# Patient Record
Sex: Female | Born: 1995 | Race: Black or African American | Hispanic: No | Marital: Single | Smoking: Never smoker
Health system: Southern US, Community
[De-identification: ages and names within clinical notes are randomized; demographics above are authoritative.]

## PROBLEM LIST (undated history)

## (undated) DIAGNOSIS — N760 Acute vaginitis: Secondary | ICD-10-CM

## (undated) DIAGNOSIS — B9689 Other specified bacterial agents as the cause of diseases classified elsewhere: Secondary | ICD-10-CM

---
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
## 2016-07-29 ENCOUNTER — Encounter (HOSPITAL_COMMUNITY): Payer: Self-pay | Admitting: Emergency Medicine

## 2016-07-29 ENCOUNTER — Emergency Department (HOSPITAL_COMMUNITY)
Admission: EM | Admit: 2016-07-29 | Discharge: 2016-07-29 | Disposition: A | Discharge status: Home / Self Care | Payer: Medicaid Other | Attending: Emergency Medicine | Admitting: Emergency Medicine

## 2016-07-29 DIAGNOSIS — Z23 Encounter for immunization: Secondary | ICD-10-CM | POA: Insufficient documentation

## 2016-07-29 DIAGNOSIS — Y939 Activity, unspecified: Secondary | ICD-10-CM | POA: Insufficient documentation

## 2016-07-29 DIAGNOSIS — Y929 Unspecified place or not applicable: Secondary | ICD-10-CM | POA: Diagnosis not present

## 2016-07-29 DIAGNOSIS — Y999 Unspecified external cause status: Secondary | ICD-10-CM | POA: Insufficient documentation

## 2016-07-29 DIAGNOSIS — S61211A Laceration without foreign body of left index finger without damage to nail, initial encounter: Secondary | ICD-10-CM | POA: Insufficient documentation

## 2016-07-29 DIAGNOSIS — S61219A Laceration without foreign body of unspecified finger without damage to nail, initial encounter: Secondary | ICD-10-CM

## 2016-07-29 DIAGNOSIS — W260XXA Contact with knife, initial encounter: Secondary | ICD-10-CM | POA: Diagnosis not present

## 2016-07-29 MED ORDER — TETANUS-DIPHTH-ACELL PERTUSSIS 5-2.5-18.5 LF-MCG/0.5 IM SUSP
0.5000 mL | Freq: Once | INTRAMUSCULAR | Status: AC
  Administered 2016-07-29: 0.5 mL via INTRAMUSCULAR
  Filled 2016-07-29: qty 0.5

## 2016-07-29 NOTE — ED Provider Notes (Signed)
[facility]EM[facility] DEPT Provider Note   CSN: [id number] Arrival date & time: 07/29/16  1647  By signing my name below, I, Clovis Pu, attest that this documentation has been prepared under the direction and in the presence of  Jejuan Scala, [place]. Electronically Signed: Clovis Pu, ED Scribe. 07/29/16. 5:21 PM.   History   Chief Complaint Chief Complaint  Patient presents with  . Laceration    finger      The history is provided by the patient. No language interpreter was used.   HPI Comments:  Tammy Sanchez is a 20 y.o. female who presents to the Emergency Department complaining of a mild laceration to the left index finger which occurred 15 minutes prior to arrival. Accidentally cut herself with a box cutter.She notes the severity of the pain as being a "5/10" and is able to bend and straighten her index finger. Pt notes she has cleaned the wound.Pt has no other physical complaints at this time. No alleviating factors noted. Pt is not sure if her tetanus is UTD.   History reviewed. No pertinent past medical history.  There are no active problems to display for this patient.   History reviewed. No pertinent surgical history.  OB History    No data available       Home Medications    Prior to Admission medications   Medication Sig Start Date End Date Taking? Authorizing Provider  fluticasone (FLONASE) 50 MCG/ACT nasal spray Place 2 sprays into both nostrils daily. 02/11/15 03/18/15  Tamika Bush, DO  metroNIDAZOLE (FLAGYL) 500 MG tablet Take 1 tablet (500 mg total) by mouth 2 (two) times daily. 12/26/15   Barrett Henle, PA-C    Family History No family history on file.  Social History Social History  Substance Use Topics  . Smoking status: Never Smoker  . Smokeless tobacco: Never Used  . Alcohol use No     Allergies   Review of patient's allergies indicates no known allergies.   Review of Systems Review of Systems 10 systems reviewed and all are negative  for acute change except as noted in the HPI.    Physical Exam Updated Vital Signs Ht 5\' 1"  (1.549 m)   Wt 110 lb (49.9 kg)   LMP 07/29/2016   BMI 20.78 kg/m   Physical Exam  Constitutional: She appears well-developed and well-nourished. No distress.  HENT:  Head: Normocephalic and atraumatic.  Neck: Neck supple.  Pulmonary/Chest: Effort normal.  Neurological: She is alert.  Skin: She is not diaphoretic.  Proximal aspect of radial left index finger with superficial 0.5cm laceration across MCP. No active bleeding. no foreign body. Full ROM of all fingers with brisk capillary refill     Nursing note and vitals reviewed.    ED Treatments / Results  DIAGNOSTIC STUDIES:  Oxygen Saturation is 100% on RA, normal by my interpretation.    COORDINATION OF CARE:  5:14 PM Discussed treatment plan with pt at bedside and pt agreed to plan.  Labs (all labs ordered are listed, but only abnormal results are displayed) Labs Reviewed - No data to display  EKG  EKG Interpretation None       Radiology No results found.  Procedures Procedures (including critical care time)  Medications Ordered in ED Medications  Tdap (BOOSTRIX) injection 0.5 mL (not administered)     Initial Impression / Assessment and Plan / ED Course  I have reviewed the triage vital signs and the nursing notes.  Pertinent labs & imaging results that  were available during my care of the patient were reviewed by me and considered in my medical decision making (see chart for details).  Clinical Course    Tetanus updated in ED. Laceration is very superficial. I visualized to the bottom of the wound and saw no tendon or bone involvement. No foreign bodies. Copiously irrigated the wound in the ED. Laceration is very superficial and does not warrant suture repair. I offered dermabond vs bandage/abx ointment. Pt would prefer not to have dermabond. I think this is reasonable. Discussed laceration care with pt  and answered questions. Pt is hemodynamically stable with no complaints prior to dc. The patient appears reasonably screened and/or stabilized for discharge and I doubt any other medical condition or other [facility] requiring further screening, evaluation, or treatment in the ED at this time prior to discharge.    Final Clinical Impressions(s) / ED Diagnoses   Final diagnoses:  Laceration of finger, initial encounter    New Prescriptions Discharge Medication List as of 07/29/2016  5:55 PM    I personally performed the services described in this documentation, which was scribed in my presence. The recorded information has been reviewed and is accurate.    Carlene CoriaSerena Y Bow Buntyn, PA-C 07/29/16 1942    Shaune Pollackameron Isaacs, MD 07/30/16 [phone]

## 2016-07-29 NOTE — Discharge Instructions (Signed)
Take ibuprofen as needed for pain. Keep the cut clean with warm water and soap. Return to the ER for new or worsening symptoms.

## 2016-07-29 NOTE — ED Triage Notes (Signed)
Patient c/o cutting finger with box cutter. Bleeding controlled. Patient c/o 5/10 pain.

## 2016-08-14 ENCOUNTER — Emergency Department (HOSPITAL_COMMUNITY)
Admission: EM | Admit: 2016-08-14 | Discharge: 2016-08-15 | Disposition: A | Discharge status: Home / Self Care | Payer: Medicaid Other | Attending: Physician Assistant | Admitting: Physician Assistant

## 2016-08-14 ENCOUNTER — Encounter (HOSPITAL_COMMUNITY): Payer: Self-pay

## 2016-08-14 DIAGNOSIS — N390 Urinary tract infection, site not specified: Secondary | ICD-10-CM | POA: Insufficient documentation

## 2016-08-14 DIAGNOSIS — B9689 Other specified bacterial agents as the cause of diseases classified elsewhere: Secondary | ICD-10-CM | POA: Diagnosis not present

## 2016-08-14 DIAGNOSIS — N760 Acute vaginitis: Secondary | ICD-10-CM | POA: Diagnosis not present

## 2016-08-14 DIAGNOSIS — R1030 Lower abdominal pain, unspecified: Secondary | ICD-10-CM | POA: Diagnosis present

## 2016-08-14 LAB — URINALYSIS, ROUTINE W REFLEX MICROSCOPIC
BILIRUBIN URINE: NEGATIVE
GLUCOSE, UA: NEGATIVE mg/dL
Hgb urine dipstick: NEGATIVE
Ketones, ur: NEGATIVE mg/dL
Nitrite: NEGATIVE
Protein, ur: NEGATIVE mg/dL
Specific Gravity, Urine: 1.017 (ref 1.005–1.030)
pH: 7 (ref 5.0–8.0)

## 2016-08-14 LAB — WET PREP, GENITAL
SPERM: NONE SEEN
TRICH WET PREP: NONE SEEN
Yeast Wet Prep HPF POC: NONE SEEN

## 2016-08-14 LAB — URINE MICROSCOPIC-ADD ON: RBC / HPF: NONE SEEN RBC/hpf (ref 0–5)

## 2016-08-14 MED ORDER — AZITHROMYCIN 250 MG PO TABS
1000.0000 mg | ORAL_TABLET | Freq: Once | ORAL | Status: AC
  Administered 2016-08-14: 1000 mg via ORAL
  Filled 2016-08-14: qty 4

## 2016-08-14 MED ORDER — LIDOCAINE HCL (PF) 1 % IJ SOLN
INTRAMUSCULAR | Status: AC
  Administered 2016-08-14: 0.9 mL
  Filled 2016-08-14: qty 5

## 2016-08-14 MED ORDER — CEFTRIAXONE SODIUM 250 MG IJ SOLR
250.0000 mg | Freq: Once | INTRAMUSCULAR | Status: AC
  Administered 2016-08-14: 250 mg via INTRAMUSCULAR
  Filled 2016-08-14: qty 250

## 2016-08-14 NOTE — ED Triage Notes (Signed)
Pt states that she has been having lower abd pain and vaginal discharge with odor for the past three days. Pt states she was recently diagnosed with BV and this feels the same.

## 2016-08-15 LAB — GC/CHLAMYDIA PROBE AMP (~~LOC~~) NOT AT ARMC
CHLAMYDIA, DNA PROBE: NEGATIVE
NEISSERIA GONORRHEA: NEGATIVE

## 2016-08-15 MED ORDER — FLUCONAZOLE 150 MG PO TABS: 150.0000 mg | ORAL_TABLET | Freq: Once | ORAL | 0 refills | Status: AC

## 2016-08-15 MED ORDER — METRONIDAZOLE 500 MG PO TABS: 500.0000 mg | ORAL_TABLET | Freq: Two times a day (BID) | ORAL | 0 refills | Status: DC

## 2016-08-15 MED ORDER — CEPHALEXIN 500 MG PO CAPS: 500.0000 mg | ORAL_CAPSULE | Freq: Three times a day (TID) | ORAL | 0 refills | Status: DC

## 2016-08-15 NOTE — Discharge Instructions (Signed)
Take both antibiotics as prescribed for one week. After completion of antibiotics take the diflucan for yeast infection. Return to the ER for new or worsening symptoms.

## 2016-08-15 NOTE — ED Notes (Signed)
Pt d/c home  

## 2016-08-15 NOTE — ED Provider Notes (Signed)
[facility]PT Provider Note   CSN: [id number] Arrival date & time: 08/14/16  1928  History   Chief Complaint Chief Complaint  Patient presents with  . Exposure to STD  . Abdominal Pain   HPI  Tammy Sanchez is an 20 y.o. female who presents to the ED for evaluation of vaginal discharge. She states she noticed a malodorous thick white discharge starting three days ago. She notes associated pressure in her lower abdomen. Denies nausea, vomiting, diarrhea. Denies vaginal bleeding. She states she has had BV before and this feels identical. Denies new rashes or lesions. She is sexually active with one partner and they do not use any barrier protection. Denies fever or chills. Denies dysuria. She does endorse urinary hesitancy and pressure. Denies hematuria.   History reviewed. No pertinent past medical history.  There are no active problems to display for this patient.   History reviewed. No pertinent surgical history.  OB History    No data available       Home Medications    Prior to Admission medications   Medication Sig Start Date End Date Taking? Authorizing Provider  fluticasone (FLONASE) 50 MCG/ACT nasal spray Place 2 sprays into both nostrils daily. Patient not taking: Reported on 08/14/2016 02/11/15 08/14/16  Tamika Bush, DO  metroNIDAZOLE (FLAGYL) 500 MG tablet Take 1 tablet (500 mg total) by mouth 2 (two) times daily. Patient not taking: Reported on 08/14/2016 12/26/15   Barrett Henle, PA-C    Family History No family history on file.  Social History Social History  Substance Use Topics  . Smoking status: Never Smoker  . Smokeless tobacco: Never Used  . Alcohol use No     Allergies   Review of patient's allergies indicates no known allergies.   Review of Systems Review of Systems 10 Systems reviewed and are negative for acute change except as noted in the HPI.  Physical Exam Updated Vital Signs BP 120/73   Pulse 72   Temp 99.1 F (37.3 C)  (Oral)   Resp 16   Ht 5\' 1"  (1.549 m)   Wt 49.9 kg   LMP 07/29/2016   SpO2 100%   BMI 20.78 kg/m   Physical Exam  Constitutional: She is oriented to person, place, and time.  HENT:  Right Ear: External ear normal.  Left Ear: External ear normal.  Nose: Nose normal.  Mouth/Throat: Oropharynx is clear and moist. No oropharyngeal exudate.  Eyes: Conjunctivae are normal.  Neck: Neck supple.  Cardiovascular: Normal rate, regular rhythm, normal heart sounds and intact distal pulses.   Pulmonary/Chest: Effort normal and breath sounds normal. No respiratory distress. She has no wheezes.  Abdominal: Soft. Bowel sounds are normal. She exhibits no distension. There is no tenderness. There is no rebound and no guarding.  No CVA tenderness  Genitourinary:  Genitourinary Comments: Thick white discharge in vaginal vault with fishy odor. Cervix nonfriable. No CMT or adnexal tenderness. No external lesions.  Musculoskeletal: She exhibits no edema.  Lymphadenopathy:    She has no cervical adenopathy.  Neurological: She is alert and oriented to person, place, and time. No cranial nerve deficit.  Skin: Skin is warm and dry.  Psychiatric: She has a normal mood and affect.  Nursing note and vitals reviewed.    ED Treatments / Results  Labs (all labs ordered are listed, but only abnormal results are displayed) Labs Reviewed  WET PREP, GENITAL - Abnormal; Notable for the following:       Result Value  Clue Cells Wet Prep HPF POC PRESENT (*)    WBC, Wet Prep HPF POC FEW (*)    All other components within normal limits  URINALYSIS, ROUTINE W REFLEX MICROSCOPIC (NOT AT [facility]) - Abnormal; Notable for the following:    APPearance CLOUDY (*)    Leukocytes, UA SMALL (*)    All other components within normal limits  URINE MICROSCOPIC-ADD ON - Abnormal; Notable for the following:    Squamous Epithelial / LPF 6-30 (*)    Bacteria, UA MANY (*)    All other components within normal limits    GC/CHLAMYDIA PROBE AMP ([place]) NOT AT [facility]    EKG  EKG Interpretation None       Radiology No results found.  Procedures Procedures (including critical care time)  Medications Ordered in ED Medications  cefTRIAXone (ROCEPHIN) injection 250 mg (250 mg Intramuscular Given 08/14/16 2326)  azithromycin (ZITHROMAX) tablet 1,000 mg (1,000 mg Oral Given 08/14/16 2326)  lidocaine (PF) (XYLOCAINE) 1 % injection (0.9 mLs  Given 08/14/16 2327)     Initial Impression / Assessment and Plan / ED Course  I have reviewed the triage vital signs and the nursing notes.  Pertinent labs & imaging results that were available during my care of the patient were reviewed by me and considered in my medical decision making (see chart for details).  Clinical Course   Wet prep reveals many clue cells and few WBC. UA with many bacteria though also evidence of contamination. With her symptoms I did recommend covering for a UTI. We will also treat her BV. Discussed option of empiric rocephin and azithromycin since pt is sexually active without protection. Pt would like to proceed. Rocephin and azithromycin given in the ED. Will send home with rx for flagyl and keflex. Will give rx for diflucan as well. HIV RPR sent. GC/chlamydia sent. ER return precautions given.  Final Clinical Impressions(s) / ED Diagnoses   Final diagnoses:  UTI (lower urinary tract infection)  BV (bacterial vaginosis)    New Prescriptions Discharge Medication List as of 08/15/2016 12:33 AM    START taking these medications   Details  cephALEXin (KEFLEX) 500 MG capsule Take 1 capsule (500 mg total) by mouth 3 (three) times daily., Starting Thu 08/15/2016, Print    fluconazole (DIFLUCAN) 150 MG tablet Take 1 tablet (150 mg total) by mouth once., Starting Thu 08/15/2016, Print    !! metroNIDAZOLE (FLAGYL) 500 MG tablet Take 1 tablet (500 mg total) by mouth 2 (two) times daily., Starting Thu 08/15/2016, Print     !! - Potential  duplicate medications found. Please discuss with provider.       Carlene CoriaSerena Y Thyra Yinger, PA-C 08/15/16 [id number]    Courteney Randall AnLyn Mackuen, MD 08/17/16 2029

## 2016-09-14 ENCOUNTER — Emergency Department (HOSPITAL_COMMUNITY)
Admission: EM | Admit: 2016-09-14 | Discharge: 2016-09-14 | Disposition: A | Discharge status: Home / Self Care | Payer: Medicaid Other | Attending: Emergency Medicine | Admitting: Emergency Medicine

## 2016-09-14 ENCOUNTER — Encounter (HOSPITAL_COMMUNITY): Payer: Self-pay | Admitting: *Deleted

## 2016-09-14 DIAGNOSIS — R300 Dysuria: Secondary | ICD-10-CM | POA: Insufficient documentation

## 2016-09-14 DIAGNOSIS — N760 Acute vaginitis: Secondary | ICD-10-CM

## 2016-09-14 DIAGNOSIS — N898 Other specified noninflammatory disorders of vagina: Secondary | ICD-10-CM | POA: Diagnosis present

## 2016-09-14 LAB — URINALYSIS, ROUTINE W REFLEX MICROSCOPIC
Bilirubin Urine: NEGATIVE
GLUCOSE, UA: NEGATIVE mg/dL
Ketones, ur: NEGATIVE mg/dL
LEUKOCYTES UA: NEGATIVE
NITRITE: NEGATIVE
PROTEIN: NEGATIVE mg/dL
Specific Gravity, Urine: 1.038 — ABNORMAL HIGH (ref 1.005–1.030)
pH: 6 (ref 5.0–8.0)

## 2016-09-14 LAB — HEPATIC FUNCTION PANEL
ALBUMIN: 4.2 g/dL (ref 3.5–5.0)
ALK PHOS: 50 U/L (ref 38–126)
ALT: 14 U/L (ref 14–54)
AST: 21 U/L (ref 15–41)
Bilirubin, Direct: 0.2 mg/dL (ref 0.1–0.5)
Indirect Bilirubin: 1.9 mg/dL — ABNORMAL HIGH (ref 0.3–0.9)
TOTAL PROTEIN: 7.1 g/dL (ref 6.5–8.1)
Total Bilirubin: 2.1 mg/dL — ABNORMAL HIGH (ref 0.3–1.2)

## 2016-09-14 LAB — CBC
HEMATOCRIT: 39.3 % (ref 36.0–46.0)
HEMOGLOBIN: 13.3 g/dL (ref 12.0–15.0)
MCH: 30.6 pg (ref 26.0–34.0)
MCHC: 33.8 g/dL (ref 30.0–36.0)
MCV: 90.6 fL (ref 78.0–100.0)
Platelets: 251 10*3/uL (ref 150–400)
RBC: 4.34 MIL/uL (ref 3.87–5.11)
RDW: 13.2 % (ref 11.5–15.5)
WBC: 4.6 10*3/uL (ref 4.0–10.5)

## 2016-09-14 LAB — URINE MICROSCOPIC-ADD ON

## 2016-09-14 LAB — WET PREP, GENITAL
CLUE CELLS WET PREP: NONE SEEN
Sperm: NONE SEEN
TRICH WET PREP: NONE SEEN
Yeast Wet Prep HPF POC: NONE SEEN

## 2016-09-14 LAB — BASIC METABOLIC PANEL
ANION GAP: 9 (ref 5–15)
BUN: 11 mg/dL (ref 6–20)
CHLORIDE: 107 mmol/L (ref 101–111)
CO2: 25 mmol/L (ref 22–32)
Calcium: 9.3 mg/dL (ref 8.9–10.3)
Creatinine, Ser: 0.77 mg/dL (ref 0.44–1.00)
GFR calc non Af Amer: >60 mL/min (ref >60)
Glucose, Bld: 85 mg/dL (ref 65–99)
POTASSIUM: 3.5 mmol/L (ref 3.5–5.1)
SODIUM: 141 mmol/L (ref 135–145)

## 2016-09-14 LAB — POC URINE PREG, ED: PREG TEST UR: NEGATIVE

## 2016-09-14 LAB — LIPASE, BLOOD: Lipase: 22 U/L (ref 11–51)

## 2016-09-14 MED ORDER — SULFAMETHOXAZOLE-TRIMETHOPRIM 800-160 MG PO TABS: 1.0000 | ORAL_TABLET | Freq: Two times a day (BID) | ORAL | 0 refills | Status: AC

## 2016-09-14 NOTE — ED Provider Notes (Signed)
[facility] Provider Note   CSN: [id number] Arrival date & time: 09/14/16  1233     History   Chief Complaint Chief Complaint  Patient presents with  . Urinary Frequency    urinary burning  . Vaginal Discharge    HPI Tammy Sanchez is a 20 y.o. G0P0 female who presents with lower abdominal pain, dysuria, vaginal discharge. Past medical history significant for bacterial vaginosis and history of UTI. She states that she was treated for bacterial vaginosis and UTI a month ago. Her symptoms did clear up after taking the medicines. One week ago she started to have left-sided flank pain. She started to have dysuria, hesitency, dyspareunia, and brown vaginal discharge with spotting 3 days ago. She does not have an OB/GYN, never had a Pap performed. Denies any previous GYN procedures. She reports she is currently sexually active with same partner however does not use protection. She was tested for gonorrhea, chlamydia, HIV, syphilis which were all negative. She was treated with Flagyl, Keflex, Rocephin, azithromycin and given a prescription for Diflucan last time she was here. Denies fever, chills, abdominal pain, nausea, vomiting, diarrhea, constipation, vaginal itching. She did not take the Diflucan.  HPI  History reviewed. No pertinent past medical history.  There are no active problems to display for this patient.   History reviewed. No pertinent surgical history.  OB History    No data available       Home Medications    Prior to Admission medications   Medication Sig Start Date End Date Taking? Authorizing Provider  cephALEXin (KEFLEX) 500 MG capsule Take 1 capsule (500 mg total) by mouth 3 (three) times daily. 08/15/16   Ace Gins Sam, PA-C  fluticasone (FLONASE) 50 MCG/ACT nasal spray Place 2 sprays into both nostrils daily. Patient not taking: Reported on 08/14/2016 02/11/15 08/14/16  Tamika Bush, DO  metroNIDAZOLE (FLAGYL) 500 MG tablet Take 1 tablet (500 mg total) by  mouth 2 (two) times daily. Patient not taking: Reported on 08/14/2016 12/26/15   Barrett Henle, PA-C  metroNIDAZOLE (FLAGYL) 500 MG tablet Take 1 tablet (500 mg total) by mouth 2 (two) times daily. 08/15/16   Carlene Coria, PA-C    Family History No family history on file.  Social History Social History  Substance Use Topics  . Smoking status: Never Smoker  . Smokeless tobacco: Never Used  . Alcohol use No     Allergies   Review of patient's allergies indicates no known allergies.   Review of Systems Review of Systems  Constitutional: Negative for chills and fever.  Gastrointestinal: Negative for abdominal pain, nausea and vomiting.  Genitourinary: Positive for difficulty urinating, dyspareunia, dysuria, flank pain, pelvic pain, vaginal bleeding, vaginal discharge and vaginal pain. Negative for hematuria and menstrual problem.  All other systems reviewed and are negative.    Physical Exam Updated Vital Signs BP 112/72 (BP Location: Left Arm)   Pulse 76   Temp 97.5 F (36.4 C) (Oral)   Resp 20   SpO2 98%   Physical Exam  Constitutional: She is oriented to person, place, and time. She appears well-developed and well-nourished. No distress.  Slightly dry mucous membranes  HENT:  Head: Normocephalic and atraumatic.  Eyes: Conjunctivae are normal. Pupils are equal, round, and reactive to light. Right eye exhibits no discharge. Left eye exhibits no discharge. No scleral icterus.  Neck: Normal range of motion. Neck supple.  Cardiovascular: Normal rate and regular rhythm.   No murmur heard. Pulmonary/Chest: Effort normal and  breath sounds normal. No respiratory distress.  Abdominal: Soft. Bowel sounds are normal. She exhibits no distension and no mass. There is no tenderness. There is no rebound and no guarding. No hernia.  Left sided mid back pain. No CVA tenderness  Genitourinary:  Genitourinary Comments: No inguinal lymphadenopathy or inguinal hernia noted. Normal  external genitalia. No pain with speculum insertion. Closed cervical os with normal appearance - no rash or lesions. There is an adherent, brown, clumpy discharge on vaginal walls. There is a clear discharge from cervix. On bimanual examination no adnexal tenderness or cervical motion tenderness. Chaperone present during exam.    Musculoskeletal: She exhibits no edema.  Neurological: She is alert and oriented to person, place, and time.  Skin: Skin is warm and dry.  Psychiatric: She has a normal mood and affect. Her behavior is normal.  Nursing note and vitals reviewed.    ED Treatments / Results  Labs (all labs ordered are listed, but only abnormal results are displayed) Labs Reviewed  WET PREP, GENITAL - Abnormal; Notable for the following:       Result Value   WBC, Wet Prep HPF POC MODERATE (*)    All other components within normal limits  URINALYSIS, ROUTINE W REFLEX MICROSCOPIC (NOT AT [facility]) - Abnormal; Notable for the following:    Color, Urine AMBER (*)    APPearance CLOUDY (*)    Specific Gravity, Urine 1.038 (*)    Hgb urine dipstick LARGE (*)    All other components within normal limits  HEPATIC FUNCTION PANEL - Abnormal; Notable for the following:    Total Bilirubin 2.1 (*)    Indirect Bilirubin 1.9 (*)    All other components within normal limits  URINE MICROSCOPIC-ADD ON - Abnormal; Notable for the following:    Squamous Epithelial / LPF 6-30 (*)    Bacteria, UA MANY (*)    All other components within normal limits  URINE CULTURE  CBC  BASIC METABOLIC PANEL  LIPASE, BLOOD  POC URINE PREG, ED  GC/CHLAMYDIA PROBE AMP ([place]) NOT AT [facility]    EKG  EKG Interpretation None       Radiology No results found.  Procedures Procedures (including critical care time)  Medications Ordered in ED Medications - No data to display   Initial Impression / Assessment and Plan / ED Course  I have reviewed the triage vital signs and the nursing notes.  Pertinent  labs & imaging results that were available during my care of the patient were reviewed by me and considered in my medical decision making (see chart for details).  Clinical Course   20 year old female presents with vaginitis and dysuria. Patient is afebrile, not tachycardic or tachypneic, normotensive, and not hypoxic. Labs are overall unremarkable. Preg test negative. UA remarkable for many WBC and large hgb, with spec gravity of 1.038 but appears contaminated. Culture sent. Pelvic exam remarkable for clumpy brown discharge. Wet prep has moderate WBC. G&C sent. Advised to take Diflucan since she has been on 4 different antibiotics within the past month. If symptoms do not get better, Bactrim rx given. Opportunity for questions provided and all questions answered. Return precautions given.   Final Clinical Impressions(s) / ED Diagnoses   Final diagnoses:  Dysuria  Acute vaginitis    New Prescriptions New Prescriptions   SULFAMETHOXAZOLE-TRIMETHOPRIM (BACTRIM DS,SEPTRA DS) 800-160 MG TABLET    Take 1 tablet by mouth 2 (two) times daily.     Bethel BornKelly Marie Desia Saban, PA-C 09/14/16  [id number]    Pricilla LovelessScott Goldston, MD 09/15/16 [phone]

## 2016-09-14 NOTE — Discharge Instructions (Signed)
Take Diflucan when you get home. If symptoms aren't getting better in a couple of days. Take the bactrim. Please try to get a primary care doctor when you get a chance. Call the number on your discharge paperwork.

## 2016-09-14 NOTE — ED Triage Notes (Signed)
Patient states she finished antibiotic 2 weeks ago for uti.  Patient states for the past 1 weeks she is having pain and burning when she voids.  She also reports vaginal discharge and some bleeding.  Patient has lower abd pain. Patient last period was at the beginning of the month

## 2016-09-15 LAB — URINE CULTURE: Culture: NO GROWTH

## 2016-09-16 LAB — GC/CHLAMYDIA PROBE AMP (~~LOC~~) NOT AT ARMC
Chlamydia: NEGATIVE
NEISSERIA GONORRHEA: NEGATIVE

## 2016-10-07 ENCOUNTER — Emergency Department (HOSPITAL_COMMUNITY): Payer: Medicaid Other

## 2016-10-07 ENCOUNTER — Other Ambulatory Visit: Payer: Self-pay

## 2016-10-07 ENCOUNTER — Encounter (HOSPITAL_COMMUNITY): Payer: Self-pay | Admitting: Vascular Surgery

## 2016-10-07 ENCOUNTER — Emergency Department (HOSPITAL_COMMUNITY)
Admission: EM | Admit: 2016-10-07 | Discharge: 2016-10-07 | Disposition: A | Discharge status: Home / Self Care | Payer: Medicaid Other | Attending: Emergency Medicine | Admitting: Emergency Medicine

## 2016-10-07 DIAGNOSIS — R091 Pleurisy: Secondary | ICD-10-CM

## 2016-10-07 DIAGNOSIS — R079 Chest pain, unspecified: Secondary | ICD-10-CM | POA: Diagnosis present

## 2016-10-07 LAB — BASIC METABOLIC PANEL
Anion gap: 9 (ref 5–15)
BUN: 8 mg/dL (ref 6–20)
CALCIUM: 9.4 mg/dL (ref 8.9–10.3)
CO2: 22 mmol/L (ref 22–32)
CREATININE: 0.67 mg/dL (ref 0.44–1.00)
Chloride: 107 mmol/L (ref 101–111)
GFR calc Af Amer: >60 mL/min (ref >60)
GFR calc non Af Amer: >60 mL/min (ref >60)
GLUCOSE: 89 mg/dL (ref 65–99)
Potassium: 3.7 mmol/L (ref 3.5–5.1)
Sodium: 138 mmol/L (ref 135–145)

## 2016-10-07 LAB — CBC
HCT: 36.6 % (ref 36.0–46.0)
HEMOGLOBIN: 12.4 g/dL (ref 12.0–15.0)
MCH: 30.2 pg (ref 26.0–34.0)
MCHC: 33.9 g/dL (ref 30.0–36.0)
MCV: 89.3 fL (ref 78.0–100.0)
PLATELETS: 296 10*3/uL (ref 150–400)
RBC: 4.1 MIL/uL (ref 3.87–5.11)
RDW: 13.2 % (ref 11.5–15.5)
WBC: 6.3 10*3/uL (ref 4.0–10.5)

## 2016-10-07 LAB — I-STAT TROPONIN, ED: Troponin i, poc: 0 ng/mL (ref 0.00–0.08)

## 2016-10-07 MED ORDER — IBUPROFEN 600 MG PO TABS: 600.0000 mg | ORAL_TABLET | Freq: Four times a day (QID) | ORAL | 0 refills | Status: DC | PRN

## 2016-10-07 NOTE — ED Provider Notes (Signed)
[facility] Provider Note   CSN: [id number] Arrival date & time: 10/07/16  1257     History   Chief Complaint Chief Complaint  Patient presents with  . Chest Pain    HPI Tammy Sanchez is a 20 y.o. female.  HPI  The patient is a 20 year old female, she is otherwise healthy, she presents to the hospital today she has been having this pain for the last 3 days when she takes a deep breath but feels it almost constantly. It is much worse when she lays down, it is not exertional, she is not coughing or having fevers and has no swelling of her legs, recent travel, trauma, immobilization, no recent surgery, she does not smoke cigarettes or use oral contraceptive pills. She denies any fevers, nausea, vomiting, diarrhea, dysuria. She does report that a couple of weeks ago she was sick with an upper respiratory infection including a sore throat which resolved spontaneously. She denies any personal or family history of cardiac disease, pulmonary embolisms or DVTs.    History reviewed. No pertinent past medical history.  There are no active problems to display for this patient.   History reviewed. No pertinent surgical history.  OB History    No data available       Home Medications    Prior to Admission medications   Medication Sig Start Date End Date Taking? Authorizing Provider  fluticasone (FLONASE) 50 MCG/ACT nasal spray Place 2 sprays into both nostrils daily. Patient not taking: Reported on 08/14/2016 02/11/15 08/14/16  Tamika Bush, DO  ibuprofen (ADVIL,MOTRIN) 600 MG tablet Take 1 tablet (600 mg total) by mouth every 6 (six) hours as needed. 10/07/16   Eber HongBrian Krissy Orebaugh, MD    Family History No family history on file.  Social History Social History  Substance Use Topics  . Smoking status: Never Smoker  . Smokeless tobacco: Never Used  . Alcohol use No     Allergies   Patient has no known allergies.   Review of Systems Review of Systems  All other systems  reviewed and are negative.    Physical Exam Updated Vital Signs BP 115/63 (BP Location: Left Arm)   Pulse 75   Temp 98.6 F (37 C) (Oral)   Resp 14   Ht 5\' 1"  (1.549 m)   Wt 110 lb (49.9 kg)   SpO2 100%   BMI 20.78 kg/m   Physical Exam  Constitutional: She appears well-developed and well-nourished. No distress.  HENT:  Head: Normocephalic and atraumatic.  Mouth/Throat: Oropharynx is clear and moist. No oropharyngeal exudate.  The oropharynx is clear and moist, there is no erythema exudate asymmetry or hypertrophy  Eyes: Conjunctivae and EOM are normal. Pupils are equal, round, and reactive to light. Right eye exhibits no discharge. Left eye exhibits no discharge. No scleral icterus.  Neck: Normal range of motion. Neck supple. No JVD present. No thyromegaly present.  Cardiovascular: Normal rate, regular rhythm, normal heart sounds and intact distal pulses.  Exam reveals no gallop and no friction rub.   No murmur heard. There is no murmurs rubs or gallops, this is not positional, the patient's lungs are clear, the heart sounds are regular without any murmurs, normal pulses, no JVD  Pulmonary/Chest: Effort normal and breath sounds normal. No respiratory distress. She has no wheezes. She has no rales.  Abdominal: Soft. Bowel sounds are normal. She exhibits no distension and no mass. There is no tenderness.  Musculoskeletal: Normal range of motion. She exhibits no edema or tenderness.  No asymmetry or edema of the lower extremities  Lymphadenopathy:    She has no cervical adenopathy.  Neurological: She is alert. Coordination normal.  Skin: Skin is warm and dry. No rash noted. No erythema.  Psychiatric: She has a normal mood and affect. Her behavior is normal.  Nursing note and vitals reviewed.    ED Treatments / Results  Labs (all labs ordered are listed, but only abnormal results are displayed) Labs Reviewed  BASIC METABOLIC PANEL  CBC  I-STAT TROPOININ, ED    EKG  EKG  Interpretation  Date/Time:  Monday October 07 2016 13:02:09 EST Ventricular Rate:  76 PR Interval:  140 QRS Duration: 70 QT Interval:  362 QTC Calculation: 407 R Axis:   2 Text Interpretation:  Normal sinus rhythm Nonspecific ST abnormality ECG OTHERWISE WITHIN NORMAL LIMITS No old tracing to compare Confirmed by Crystle Carelli  MD, Jenaye Rickert ([id number]) on 10/07/2016 3:32:20 PM       Radiology Dg Chest 2 View  Result Date: 10/07/2016 CLINICAL DATA:  Chest pain for 3 days.  Shortness of breath. EXAM: CHEST  2 VIEW COMPARISON:  None. FINDINGS: The heart size and mediastinal contours are within normal limits. Both lungs are clear. The visualized skeletal structures are unremarkable. Incidentally, there are bilateral nipple piercings. IMPRESSION: No active cardiopulmonary disease. Electronically Signed   By: Richarda OverlieAdam  Henn M.D.   On: 10/07/2016 13:56    Procedures Procedures (including critical care time)  Medications Ordered in ED Medications - No data to display   Initial Impression / Assessment and Plan / ED Course  I have reviewed the triage vital signs and the nursing notes.  Pertinent labs & imaging results that were available during my care of the patient were reviewed by me and considered in my medical decision making (see chart for details).  Clinical Course     Well-appearing, no signs of pulmonary embolism, low risk, perked negative, no signs of ACS, EKG unremarkable, the patient can follow-up in the outpatient setting. She was informed that this was likely pleurisy, given ibuprofen prescription, expressed understanding.   Final Clinical Impressions(s) / ED Diagnoses   Final diagnoses:  Pleurisy    New Prescriptions Discharge Medication List as of 10/07/2016  3:42 PM    START taking these medications   Details  ibuprofen (ADVIL,MOTRIN) 600 MG tablet Take 1 tablet (600 mg total) by mouth every 6 (six) hours as needed., Starting Mon 10/07/2016, Print         Eber HongBrian Suhaib Guzzo,  MD 10/07/16 1549

## 2016-10-07 NOTE — ED Triage Notes (Signed)
Pt reports to the ED for eval of CP and SOB x 3 days. States movement and deep breaths make the pain worse. Pt denies any recent cough, illness, or injury to her chest. Denies any personal or family hx of cardiac problems. Pt also denies any fevers, chills, N/V, lightheadedness, or dizziness.

## 2016-10-07 NOTE — Discharge Instructions (Signed)

## 2016-10-23 ENCOUNTER — Emergency Department (HOSPITAL_COMMUNITY)
Admission: EM | Admit: 2016-10-23 | Discharge: 2016-10-23 | Disposition: A | Discharge status: Home / Self Care | Payer: Medicaid Other | Attending: Emergency Medicine | Admitting: Emergency Medicine

## 2016-10-23 ENCOUNTER — Emergency Department (HOSPITAL_COMMUNITY): Payer: Medicaid Other

## 2016-10-23 ENCOUNTER — Encounter (HOSPITAL_COMMUNITY): Payer: Self-pay | Admitting: *Deleted

## 2016-10-23 DIAGNOSIS — W0110XA Fall on same level from slipping, tripping and stumbling with subsequent striking against unspecified object, initial encounter: Secondary | ICD-10-CM | POA: Insufficient documentation

## 2016-10-23 DIAGNOSIS — Y939 Activity, unspecified: Secondary | ICD-10-CM | POA: Insufficient documentation

## 2016-10-23 DIAGNOSIS — S022XXA Fracture of nasal bones, initial encounter for closed fracture: Secondary | ICD-10-CM

## 2016-10-23 DIAGNOSIS — Y929 Unspecified place or not applicable: Secondary | ICD-10-CM | POA: Diagnosis not present

## 2016-10-23 DIAGNOSIS — Y999 Unspecified external cause status: Secondary | ICD-10-CM | POA: Insufficient documentation

## 2016-10-23 DIAGNOSIS — S0993XA Unspecified injury of face, initial encounter: Secondary | ICD-10-CM | POA: Diagnosis present

## 2016-10-23 LAB — POC URINE PREG, ED: PREG TEST UR: NEGATIVE

## 2016-10-23 MED ORDER — TRAMADOL HCL 50 MG PO TABS: 50.0000 mg | ORAL_TABLET | Freq: Four times a day (QID) | ORAL | 0 refills | Status: DC | PRN

## 2016-10-23 NOTE — ED Triage Notes (Signed)
Pt tripped and fell against a wall, hitting her nose. Feels that her nose is out of place.

## 2016-10-23 NOTE — Discharge Instructions (Signed)
Do not drive while taking the narcotic as it will make you sleepy. Take ibuprofen for inflammation. Apply ice to the area.  Call Dr. Dionicio StallBates's office for follow up.

## 2016-10-23 NOTE — ED Provider Notes (Signed)
[facility]-EMERGENCY DEPT Provider Note   CSN: [id number] Arrival date & time: 10/23/16  2032  By signing my name below, I, Emmanuella Mensah, attest that this documentation has been prepared under the direction and in the presence of Kerrie BuffaloHope Jazline Cumbee, NP. Electronically Signed: Angelene GiovanniEmmanuella Mensah, ED Scribe. 10/23/16. 10:24 PM.   History   Chief Complaint Chief Complaint  Patient presents with  . Facial Injury    HPI Comments: Tammy Sanchez is a 20 y.o. female who presents to the Emergency Department complaining of gradually worsening moderate pain to the bridge of nose with deformity s/p facial injury that occurred 2 days ago. Pt explains that she tripped and fell, hitting her nose on the wall. She denies any LOC or any other injuries. No alleviating factors noted. Pt has not tried any medications PTA. She has NKDA. She denies any fever, chills, nausea, vomiting, nosebleed, or any other symptoms.   The history is provided by the patient. No language interpreter was used.    History reviewed. No pertinent past medical history.  There are no active problems to display for this patient.   History reviewed. No pertinent surgical history.  OB History    No data available       Home Medications    Prior to Admission medications   Medication Sig Start Date End Date Taking? Authorizing Provider  fluticasone (FLONASE) 50 MCG/ACT nasal spray Place 2 sprays into both nostrils daily. Patient not taking: Reported on 08/14/2016 02/11/15 08/14/16  Tamika Bush, DO  ibuprofen (ADVIL,MOTRIN) 600 MG tablet Take 1 tablet (600 mg total) by mouth every 6 (six) hours as needed. 10/07/16   Eber HongBrian Miller, MD  traMADol (ULTRAM) 50 MG tablet Take 1 tablet (50 mg total) by mouth every 6 (six) hours as needed. 10/23/16   Andris Brothers Orlene OchM Bettyanne Dittman, NP    Family History No family history on file.  Social History Social History  Substance Use Topics  . Smoking status: Never Smoker  . Smokeless tobacco: Never Used  . Alcohol  use No     Allergies   Patient has no known allergies.   Review of Systems Review of Systems  Constitutional: Negative for chills and fever.  HENT: Negative for nosebleeds.        Nasal swelling and pain  Eyes: Negative for visual disturbance.  Gastrointestinal: Negative for nausea and vomiting.  Musculoskeletal: Positive for arthralgias.  Skin: Negative for wound.  Neurological: Negative for syncope and headaches.     Physical Exam Updated Vital Signs BP 124/77 (BP Location: Left Arm)   Pulse 76   Temp 98.1 F (36.7 C) (Oral)   Resp 16   LMP 10/03/2016   SpO2 100%   Physical Exam  Constitutional: She is oriented to person, place, and time. She appears well-developed and well-nourished. No distress.  HENT:  Right Ear: Tympanic membrane normal.  Left Ear: Tympanic membrane normal.  Nose: Mucosal edema and nasal deformity present. No nasal septal hematoma. No epistaxis.  Mouth/Throat: Uvula is midline and mucous membranes are normal. No posterior oropharyngeal edema or posterior oropharyngeal erythema.  Nasal bridge with swelling, mild deformity, tender on palpation. Nasal mucosa swollen.   Eyes: Conjunctivae and EOM are normal. Pupils are equal, round, and reactive to light.  Sclera clear  Neck: Neck supple. No tracheal deviation present.  Cardiovascular: Normal rate.   Pulmonary/Chest: Effort normal. No respiratory distress.  Musculoskeletal: Normal range of motion.  Neurological: She is alert and oriented to person, place, and time.  Skin: Skin  is warm and dry.  Psychiatric: She has a normal mood and affect. Her behavior is normal.  Nursing note and vitals reviewed.    ED Treatments / Results  DIAGNOSTIC STUDIES: Oxygen Saturation is 100% on RA, normal by my interpretation.    COORDINATION OF CARE: 10:10 PM- Pt advised of plan for treatment and pt agrees. Pt will receive nasal bone x-ray for further evaluation. She declines pain medication here in the ED.      Labs (all labs ordered are listed, but only abnormal results are displayed) Labs Reviewed  POC URINE PREG, ED    Radiology Dg Nasal Bones  Result Date: 10/23/2016 CLINICAL DATA:  Initial evaluation for acute injury. EXAM: NASAL BONES - 3+ VIEW COMPARISON:  None. FINDINGS: There is minimal displacement of the left-sided nasal bones, suspicious for possible acute nasal bone fracture. Right nasal bones grossly intact. Question of mild overlying soft tissue swelling. Remainder the bones of the face within normal limits. Paranasal sinuses grossly clear. IMPRESSION: Minimal subluxation/ displacement of the left nasal bone, suspicious for acute fracture. Electronically Signed   By: Rise MuBenjamin  McClintock M.D.   On: 10/23/2016 22:41    Procedures Procedures (including critical care time)  Medications Ordered in ED Medications - No data to display   Initial Impression / Assessment and Plan / ED Course  Kerrie BuffaloHope Atharv Barriere, NP has reviewed the triage vital signs and the nursing notes.  Pertinent labs & imaging results that were available during my care of the patient were reviewed by me and considered in my medical decision making (see chart for details).  Clinical Course    Patient X-Ray with nasal fracture.Pain managed in ED. Pt advised to follow up with ENT. Patient will be dc home & is agreeable with above plan.   Final Clinical Impressions(s) / ED Diagnoses   Final diagnoses:  Closed fracture of nasal bone, initial encounter    New Prescriptions Discharge Medication List as of 10/23/2016 11:04 PM    START taking these medications   Details  traMADol (ULTRAM) 50 MG tablet Take 1 tablet (50 mg total) by mouth every 6 (six) hours as needed., Starting Wed 10/23/2016, Print      I personally performed the services described in this documentation, which was scribed in my presence. The recorded information has been reviewed and is accurate.    [address]Greta Yung MeridianM Aracelia Brinson, [place]NP 10/24/16 [id number]    Tomasita CrumbleAdeleke Oni,  MD 10/24/16 [phone]

## 2016-10-23 NOTE — ED Notes (Signed)
Pt going to xray  

## 2016-10-23 NOTE — ED Notes (Signed)
See edp assessment 

## 2016-11-11 ENCOUNTER — Emergency Department (HOSPITAL_COMMUNITY)
Admission: EM | Admit: 2016-11-11 | Discharge: 2016-11-11 | Disposition: A | Discharge status: Home / Self Care | Payer: Medicaid Other | Attending: Emergency Medicine | Admitting: Emergency Medicine

## 2016-11-11 ENCOUNTER — Encounter (HOSPITAL_COMMUNITY): Payer: Self-pay

## 2016-11-11 DIAGNOSIS — R102 Pelvic and perineal pain: Secondary | ICD-10-CM

## 2016-11-11 LAB — URINALYSIS, ROUTINE W REFLEX MICROSCOPIC
Bacteria, UA: NONE SEEN
Bilirubin Urine: NEGATIVE
GLUCOSE, UA: NEGATIVE mg/dL
Hgb urine dipstick: NEGATIVE
KETONES UR: 20 mg/dL — AB
Leukocytes, UA: NEGATIVE
Nitrite: NEGATIVE
PH: 6 (ref 5.0–8.0)
Protein, ur: 30 mg/dL — AB
Specific Gravity, Urine: 1.031 — ABNORMAL HIGH (ref 1.005–1.030)

## 2016-11-11 LAB — WET PREP, GENITAL
CLUE CELLS WET PREP: NONE SEEN
SPERM: NONE SEEN
TRICH WET PREP: NONE SEEN
Yeast Wet Prep HPF POC: NONE SEEN

## 2016-11-11 LAB — POC URINE PREG, ED: Preg Test, Ur: NEGATIVE

## 2016-11-11 MED ORDER — IBUPROFEN 400 MG PO TABS
400.0000 mg | ORAL_TABLET | Freq: Once | ORAL | Status: AC
  Administered 2016-11-11: 400 mg via ORAL
  Filled 2016-11-11: qty 1

## 2016-11-11 NOTE — ED Provider Notes (Signed)
[facility]-EM[facility]Y DEPT Provider Note   CSN: [id number] Arrival date & time: 11/11/16  1931     History   Chief Complaint Chief Complaint  Patient presents with  . Dysuria    HPI Tammy Sanchez is a 20 y.o. female.  Patient with no significant medical history, no history of pregnancy or STDs presents with urinary symptoms and pelvic discomfort. 2 days duration. No new sexual partners no vaginal discharge. Symptoms mild lower back and suprapubic. No fevers chills or vomiting. No flank pain.      History reviewed. No pertinent past medical history.  There are no active problems to display for this patient.   History reviewed. No pertinent surgical history.  OB History    No data available       Home Medications    Prior to Admission medications   Medication Sig Start Date End Date Taking? Authorizing Provider  fluticasone (FLONASE) 50 MCG/ACT nasal spray Place 2 sprays into both nostrils daily. Patient not taking: Reported on 08/14/2016 02/11/15 08/14/16  Tamika Bush, DO  ibuprofen (ADVIL,MOTRIN) 600 MG tablet Take 1 tablet (600 mg total) by mouth every 6 (six) hours as needed. 10/07/16   Eber HongBrian Miller, MD  traMADol (ULTRAM) 50 MG tablet Take 1 tablet (50 mg total) by mouth every 6 (six) hours as needed. 10/23/16   Hope Orlene OchM Neese, NP    Family History History reviewed. No pertinent family history.  Social History Social History  Substance Use Topics  . Smoking status: Never Smoker  . Smokeless tobacco: Never Used  . Alcohol use No     Allergies   Patient has no known allergies.   Review of Systems Review of Systems  Constitutional: Negative for fever.  Respiratory: Negative for shortness of breath.   Gastrointestinal: Positive for abdominal pain.  Genitourinary: Positive for frequency.     Physical Exam Updated Vital Signs BP 107/56 (BP Location: Right Arm)   Pulse 86   Temp 98.5 F (36.9 C) (Oral)   Resp 14   Ht 5\' 1"  (1.549 m)   Wt 115 lb 3 oz (52.2  kg)   LMP 10/31/2016   SpO2 100%   BMI 21.76 kg/m   Physical Exam  Constitutional: She appears well-developed and well-nourished. No distress.  HENT:  Head: Normocephalic and atraumatic.  Neck: Neck supple.  Cardiovascular: Normal rate.   Pulmonary/Chest: Effort normal.  Abdominal: Soft. There is tenderness.  Genitourinary:  Genitourinary Comments: No discharge, mild cmt  Musculoskeletal: She exhibits no edema.  Neurological: She is alert.  Skin: Skin is warm and dry.  Psychiatric: She has a normal mood and affect.  Nursing note and vitals reviewed.    ED Treatments / Results  Labs (all labs ordered are listed, but only abnormal results are displayed) Labs Reviewed  WET PREP, GENITAL - Abnormal; Notable for the following:       Result Value   WBC, Wet Prep HPF POC MODERATE (*)    All other components within normal limits  URINALYSIS, ROUTINE W REFLEX MICROSCOPIC - Abnormal; Notable for the following:    APPearance HAZY (*)    Specific Gravity, Urine 1.031 (*)    Ketones, ur 20 (*)    Protein, ur 30 (*)    Squamous Epithelial / LPF 6-30 (*)    All other components within normal limits  POC URINE PREG, ED  [facility]    EKG  EKG Interpretation None  Radiology No results found.  Procedures Procedures (including critical care time)  Medications Ordered in ED Medications  ibuprofen (ADVIL,MOTRIN) tablet 400 mg (400 mg Oral Given 11/11/16 2125)     Initial Impression / Assessment and Plan / ED Course  I have reviewed the triage vital signs and the nursing notes.  Pertinent labs & imaging results that were available during my care of the patient were reviewed by me and considered in my medical decision making (see chart for details).  Clinical Course    Patient presents with concern for UTI. Urinalysis overall unremarkable. Pelvic exam mild tenderness, patient is very low suspicion for STD and is comfortable  following culture results in 2 days.  Results and differential diagnosis were discussed with the patient/parent/guardian. Xrays were independently reviewed by myself.  Close follow up outpatient was discussed, comfortable with the plan.   Medications  ibuprofen (ADVIL,MOTRIN) tablet 400 mg (400 mg Oral Given 11/11/16 2125)    Vitals:   11/11/16 1934 11/11/16 1935 11/11/16 2127  BP:  115/71 107/56  Pulse:  80 86  Resp:  17 14  Temp:  98.4 F (36.9 C) 98.5 F (36.9 C)  TempSrc:  Oral Oral  SpO2:  100% 100%  Weight: 115 lb 3 oz (52.2 kg)    Height: 5\' 1"  (1.549 m)      Final diagnoses:  Pelvic pain    Final Clinical Impressions(s) / ED Diagnoses   Final diagnoses:  Pelvic pain    New Prescriptions New Prescriptions   No medications on file     Blane OharaJoshua Dashel Goines, MD 11/11/16 2227

## 2016-11-11 NOTE — ED Triage Notes (Signed)
Pt complaining of lower abdominal pain x 2 days. Pt states urinary symptoms. Pt believes she has a UTI. Pt denies any vaginal bleeding or discharge.

## 2016-11-11 NOTE — Discharge Instructions (Signed)
If you were given medicines take as directed.  If you are on coumadin or contraceptives realize their levels and effectiveness is altered by many different medicines.  If you have any reaction (rash, tongues swelling, other) to the medicines stop taking and see a physician.    If your blood pressure was elevated in the ER make sure you follow up for management with a primary doctor or return for chest pain, shortness of breath or stroke symptoms.  Please follow up as directed and return to the ER or see a physician for new or worsening symptoms.  Thank you. Vitals:   11/11/16 1934 11/11/16 1935 11/11/16 2127  BP:  115/71 107/56  Pulse:  80 86  Resp:  17 14  Temp:  98.4 F (36.9 C) 98.5 F (36.9 C)  TempSrc:  Oral Oral  SpO2:  100% 100%  Weight: 115 lb 3 oz (52.2 kg)    Height: 5\' 1"  (1.549 m)

## 2016-11-12 LAB — GC/CHLAMYDIA PROBE AMP (~~LOC~~) NOT AT ARMC
Chlamydia: NEGATIVE
NEISSERIA GONORRHEA: NEGATIVE

## 2017-03-19 ENCOUNTER — Encounter (HOSPITAL_COMMUNITY): Payer: Self-pay | Admitting: Emergency Medicine

## 2017-03-19 DIAGNOSIS — M545 Low back pain: Secondary | ICD-10-CM | POA: Diagnosis not present

## 2017-03-19 DIAGNOSIS — Z79899 Other long term (current) drug therapy: Secondary | ICD-10-CM | POA: Insufficient documentation

## 2017-03-19 DIAGNOSIS — Z5321 Procedure and treatment not carried out due to patient leaving prior to being seen by health care provider: Secondary | ICD-10-CM | POA: Insufficient documentation

## 2017-03-19 DIAGNOSIS — R109 Unspecified abdominal pain: Secondary | ICD-10-CM | POA: Diagnosis not present

## 2017-03-19 LAB — COMPREHENSIVE METABOLIC PANEL
ALT: 11 U/L — AB (ref 14–54)
ANION GAP: 9 (ref 5–15)
AST: 17 U/L (ref 15–41)
Albumin: 3.5 g/dL (ref 3.5–5.0)
Alkaline Phosphatase: 63 U/L (ref 38–126)
BUN: 7 mg/dL (ref 6–20)
CHLORIDE: 104 mmol/L (ref 101–111)
CO2: 25 mmol/L (ref 22–32)
CREATININE: 0.74 mg/dL (ref 0.44–1.00)
Calcium: 9.1 mg/dL (ref 8.9–10.3)
Glucose, Bld: 97 mg/dL (ref 65–99)
POTASSIUM: 3.6 mmol/L (ref 3.5–5.1)
Sodium: 138 mmol/L (ref 135–145)
Total Bilirubin: 0.7 mg/dL (ref 0.3–1.2)
Total Protein: 6.6 g/dL (ref 6.5–8.1)

## 2017-03-19 LAB — CBC
HCT: 36.5 % (ref 36.0–46.0)
Hemoglobin: 12.1 g/dL (ref 12.0–15.0)
MCH: 29.7 pg (ref 26.0–34.0)
MCHC: 33.2 g/dL (ref 30.0–36.0)
MCV: 89.7 fL (ref 78.0–100.0)
PLATELETS: 274 10*3/uL (ref 150–400)
RBC: 4.07 MIL/uL (ref 3.87–5.11)
RDW: 13.1 % (ref 11.5–15.5)
WBC: 5.8 10*3/uL (ref 4.0–10.5)

## 2017-03-19 LAB — URINALYSIS, ROUTINE W REFLEX MICROSCOPIC
Bacteria, UA: NONE SEEN
Bilirubin Urine: NEGATIVE
GLUCOSE, UA: NEGATIVE mg/dL
KETONES UR: NEGATIVE mg/dL
Leukocytes, UA: NEGATIVE
Nitrite: NEGATIVE
PH: 6 (ref 5.0–8.0)
Protein, ur: NEGATIVE mg/dL
Specific Gravity, Urine: 1.015 (ref 1.005–1.030)

## 2017-03-19 LAB — POC URINE PREG, ED: PREG TEST UR: NEGATIVE

## 2017-03-19 NOTE — ED Triage Notes (Signed)
Patient with abdominal pain and lower back pain.  She states she is having pressure in lower abdomen like she had with her last UTI.  She does have some dark brown discharge.

## 2017-03-20 ENCOUNTER — Emergency Department (HOSPITAL_COMMUNITY)
Admission: EM | Admit: 2017-03-20 | Discharge: 2017-03-20 | Disposition: A | Discharge status: Home / Self Care | Payer: Medicaid Other | Attending: Emergency Medicine | Admitting: Emergency Medicine

## 2017-03-20 NOTE — ED Notes (Signed)
Patient was called multiple times with no answer from waiting room.

## 2017-04-24 ENCOUNTER — Encounter (HOSPITAL_COMMUNITY): Payer: Self-pay | Admitting: Emergency Medicine

## 2017-04-24 ENCOUNTER — Emergency Department (HOSPITAL_COMMUNITY)
Admission: EM | Admit: 2017-04-24 | Discharge: 2017-04-24 | Disposition: A | Discharge status: Home / Self Care | Payer: Medicaid Other | Attending: Emergency Medicine | Admitting: Emergency Medicine

## 2017-04-24 DIAGNOSIS — M545 Low back pain: Secondary | ICD-10-CM | POA: Insufficient documentation

## 2017-04-24 DIAGNOSIS — R300 Dysuria: Secondary | ICD-10-CM | POA: Diagnosis present

## 2017-04-24 DIAGNOSIS — Z5321 Procedure and treatment not carried out due to patient leaving prior to being seen by health care provider: Secondary | ICD-10-CM | POA: Insufficient documentation

## 2017-04-24 LAB — URINALYSIS, ROUTINE W REFLEX MICROSCOPIC
BILIRUBIN URINE: NEGATIVE
Glucose, UA: NEGATIVE mg/dL
HGB URINE DIPSTICK: NEGATIVE
KETONES UR: NEGATIVE mg/dL
Leukocytes, UA: NEGATIVE
Nitrite: NEGATIVE
PH: 6 (ref 5.0–8.0)
Protein, ur: NEGATIVE mg/dL
SPECIFIC GRAVITY, URINE: 1.029 (ref 1.005–1.030)

## 2017-04-24 NOTE — ED Notes (Signed)
Pt came out of room and went to bathroom. Pt came out and walked past her room. I asked pt are you looking for your room she said no. I then asked pt are you leaving and she said no but then walked down the hallway to the waiting room. [facility] and [facility] notified

## 2017-04-24 NOTE — ED Notes (Signed)
Went to waiting room unable to locate pt

## 2017-04-24 NOTE — ED Triage Notes (Signed)
Pt. suspects UTI reports dysuria with urinary frequency and low back pain onset this week , denies hematuria or fever .

## 2017-04-29 ENCOUNTER — Encounter (HOSPITAL_COMMUNITY): Payer: Self-pay | Admitting: Emergency Medicine

## 2017-04-29 ENCOUNTER — Emergency Department (HOSPITAL_COMMUNITY)
Admission: EM | Admit: 2017-04-29 | Discharge: 2017-04-29 | Disposition: A | Discharge status: Home / Self Care | Payer: Medicaid Other | Attending: Emergency Medicine | Admitting: Emergency Medicine

## 2017-04-29 DIAGNOSIS — X58XXXA Exposure to other specified factors, initial encounter: Secondary | ICD-10-CM | POA: Insufficient documentation

## 2017-04-29 DIAGNOSIS — Y999 Unspecified external cause status: Secondary | ICD-10-CM | POA: Insufficient documentation

## 2017-04-29 DIAGNOSIS — Y939 Activity, unspecified: Secondary | ICD-10-CM | POA: Diagnosis not present

## 2017-04-29 DIAGNOSIS — T162XXA Foreign body in left ear, initial encounter: Secondary | ICD-10-CM | POA: Insufficient documentation

## 2017-04-29 DIAGNOSIS — N3001 Acute cystitis with hematuria: Secondary | ICD-10-CM | POA: Insufficient documentation

## 2017-04-29 DIAGNOSIS — Z79899 Other long term (current) drug therapy: Secondary | ICD-10-CM | POA: Insufficient documentation

## 2017-04-29 DIAGNOSIS — Y929 Unspecified place or not applicable: Secondary | ICD-10-CM | POA: Diagnosis not present

## 2017-04-29 DIAGNOSIS — S00452A Superficial foreign body of left ear, initial encounter: Secondary | ICD-10-CM

## 2017-04-29 LAB — URINALYSIS, ROUTINE W REFLEX MICROSCOPIC
Bilirubin Urine: NEGATIVE
GLUCOSE, UA: NEGATIVE mg/dL
KETONES UR: 20 mg/dL — AB
Leukocytes, UA: NEGATIVE
NITRITE: NEGATIVE
PROTEIN: 30 mg/dL — AB
Specific Gravity, Urine: 1.039 — ABNORMAL HIGH (ref 1.005–1.030)
Squamous Epithelial / LPF: NONE SEEN
pH: 5 (ref 5.0–8.0)

## 2017-04-29 LAB — POC URINE PREG, ED: Preg Test, Ur: NEGATIVE

## 2017-04-29 MED ORDER — PHENAZOPYRIDINE HCL 95 MG PO TABS: 95.0000 mg | ORAL_TABLET | Freq: Three times a day (TID) | ORAL | 0 refills | Status: DC | PRN

## 2017-04-29 MED ORDER — SULFAMETHOXAZOLE-TRIMETHOPRIM 800-160 MG PO TABS
1.0000 | ORAL_TABLET | Freq: Once | ORAL | Status: AC
  Administered 2017-04-29: 1 via ORAL
  Filled 2017-04-29: qty 1

## 2017-04-29 MED ORDER — SULFAMETHOXAZOLE-TRIMETHOPRIM 800-160 MG PO TABS: 1.0000 | ORAL_TABLET | Freq: Two times a day (BID) | ORAL | 0 refills | Status: AC

## 2017-04-29 MED ORDER — LIDOCAINE HCL 2 % IJ SOLN
10.0000 mL | Freq: Once | INTRAMUSCULAR | Status: AC
  Administered 2017-04-29: 200 mg via INTRADERMAL
  Filled 2017-04-29: qty 20

## 2017-04-29 NOTE — ED Provider Notes (Signed)
[facility]-EMERGENCY DEPT Provider Note   CSN: [id number] Arrival date & time: 04/29/17  1519     History   Chief Complaint Chief Complaint  Patient presents with  . Urinary Frequency    HPI Tammy Sanchez is a 21 y.o. female presents today with chief complaint acute onset, constant urinary frequency and dysuria for 2 weeks. She states she feels a constant lower abdominal pressure that is mildly sharp with no radiation. She feels this pain worsens during urination. She also endorses frequency and decreased urine output with a feeling of fullness in her bladder after urinating. She denies hematuria, but states she noted a pink tinge to her urine on providing a sample today. She states she believes this is related to onset of menses today. She denies nausea, vomiting, constipation, diarrhea, melena, hematochezia, fevers, flank pain, back pain, vaginal itching, discharge, or pain. She has had a UTI in the past and states this feels exactly the same.  She also endorses development of left ear pain and swelling secondary to 3 piercings that she received 3 weeks ago. She notes "I think the skin is starting to grow over them ". She endorses tenderness to palpation and mild pain. She states she has not been cleaning these piercings. 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 She denies discharge or bleeding.  The history is provided by the patient.    History reviewed. No pertinent past medical history.  There are no active problems to display for this patient.   History reviewed. No pertinent surgical history.  OB History    No data available       Home Medications    Prior to Admission medications   Medication Sig Start Date End Date Taking? Authorizing Provider  fluticasone (FLONASE) 50 MCG/ACT nasal spray Place 2 sprays into both nostrils daily. Patient not taking: Reported on 08/14/2016 02/11/15 08/14/16  Truddie Coco, DO  ibuprofen (ADVIL,MOTRIN) 600 MG tablet Take 1 tablet  (600 mg total) by mouth every 6 (six) hours as needed. 10/07/16   Eber Hong, MD  phenazopyridine (PYRIDIUM) 95 MG tablet Take 1 tablet (95 mg total) by mouth 3 (three) times daily as needed for pain. 04/29/17   Jack Bolio A, PA-C  sulfamethoxazole-trimethoprim (BACTRIM DS,SEPTRA DS) 800-160 MG tablet Take 1 tablet by mouth 2 (two) times daily. 04/29/17 05/06/17  Michela Pitcher A, PA-C  traMADol (ULTRAM) 50 MG tablet Take 1 tablet (50 mg total) by mouth every 6 (six) hours as needed. 10/23/16   Janne Napoleon, NP    Family History No family history on file.  Social History Social History  Substance Use Topics  . Smoking status: Never Smoker  . Smokeless tobacco: Never Used  . Alcohol use No     Allergies   Patient has no known allergies.   Review of Systems Review of Systems  Constitutional: Negative for fever.  HENT: Positive for ear pain.   Gastrointestinal: Positive for abdominal pain. Negative for blood in stool, constipation, diarrhea, nausea and vomiting.  Genitourinary: Positive for decreased urine volume, dysuria, frequency and hematuria. Negative for flank pain, vaginal discharge and vaginal pain.  Musculoskeletal: Negative for back pain.  All other systems reviewed and are negative.    Physical Exam Updated Vital Signs BP 125/77 (BP Location: Left Arm)   Pulse 90   Temp 97.9 F (36.6 C) (Oral)   Resp 18   Ht 5\' 1"  (1.549 m)   Wt 50.3 kg (111 lb)   LMP  03/27/2017   SpO2 100%   BMI 20.97 kg/m   Physical Exam  Constitutional: She appears well-developed and well-nourished. No distress.  HENT:  Head: Normocephalic and atraumatic.  Left ear with three stud piercings along the helix. The helix is mildly erythematous and swollen. The most inferior piercing draining purulent material. There is yellow crusting surrounding the opening of each piercing. Middle piercing is missing the anterior stud and posteriorly there appears to be an overgrowth of skin  Eyes:  Conjunctivae are normal. Right eye exhibits no discharge. Left eye exhibits no discharge.  Neck: No JVD present. No tracheal deviation present.  Cardiovascular: Normal rate.   Pulmonary/Chest: Effort normal.  Abdominal: Soft. Bowel sounds are normal. She exhibits no distension. There is tenderness. There is no guarding.  Mild suprapubic tenderness, murphy's absent, rovsing's absent, no ttp at Mcburney's point  Musculoskeletal: She exhibits no edema.  Neurological: She is alert.  Skin: No erythema.  Psychiatric: She has a normal mood and affect. Her behavior is normal.  Nursing note and vitals reviewed.    ED Treatments / Results  Labs (all labs ordered are listed, but only abnormal results are displayed) Labs Reviewed  URINALYSIS, ROUTINE W REFLEX MICROSCOPIC - Abnormal; Notable for the following:       Result Value   APPearance TURBID (*)    Specific Gravity, Urine 1.039 (*)    Hgb urine dipstick LARGE (*)    Ketones, ur 20 (*)    Protein, ur 30 (*)    Bacteria, UA RARE (*)    All other components within normal limits  URINE CULTURE  POC URINE PREG, ED    EKG  EKG Interpretation None       Radiology No results found.  Procedures .Foreign Body Removal Date/Time: 04/29/2017 5:29 PM Performed by: Michela Pitcher A Authorized by: Michela Pitcher A  Consent: Verbal consent obtained. Consent given by: patient Patient understanding: patient states understanding of the procedure being performed Patient consent: the patient's understanding of the procedure matches consent given Relevant documents: relevant documents present and verified Imaging studies: imaging studies not available Required items: required blood products, implants, devices, and special equipment available Patient identity confirmed: verbally with patient and arm band Body area: ear Location details: left ear Anesthesia: local infiltration  Anesthesia: Local Anesthetic: lidocaine 2% without  epinephrine Anesthetic total: 1 mL  Sedation: Patient sedated: no Patient restrained: no Localization method: visualized Removal mechanism: forceps and irrigation Complexity: simple 3 objects recovered. Objects recovered: earrings Post-procedure assessment: foreign body removed Patient tolerance: Patient tolerated the procedure well with no immediate complications   (including critical care time)  Medications Ordered in ED Medications  sulfamethoxazole-trimethoprim (BACTRIM DS,SEPTRA DS) 800-160 MG per tablet 1 tablet (not administered)  lidocaine (XYLOCAINE) 2 % (with pres) injection 200 mg (200 mg Intradermal Given 04/29/17 1716)     Initial Impression / Assessment and Plan / ED Course  I have reviewed the triage vital signs and the nursing notes.  Pertinent labs & imaging results that were available during my care of the patient were reviewed by me and considered in my medical decision making (see chart for details).     Patient with 2 weeks of urinary symptoms as well as erythema and swelling of the left ear suggestive of infection from new piercings. Afebrile, vital signs stable. UA consistent with UTI, will treat with Bactrim. Low suspicion of other intra-abdominal processes such as appendicitis, pyelonephritis, nephrolithiasis, PID, TOA, or ovarian torsion. 3 earrings removed from left  ear, irrigated and cleaned, which patient tolerated well. Recommend follow-up with primary care for reevaluation of UTI and wound check. Discussed indications for return to the ED. Pt verbalized understanding of and agreement with plan and is safe for discharge home at this time.  Final Clinical Impressions(s) / ED Diagnoses   Final diagnoses:  Acute cystitis with hematuria  Embedded earring of left ear, initial encounter    New Prescriptions New Prescriptions   PHENAZOPYRIDINE (PYRIDIUM) 95 MG TABLET    Take 1 tablet (95 mg total) by mouth 3 (three) times daily as needed for pain.    SULFAMETHOXAZOLE-TRIMETHOPRIM (BACTRIM DS,SEPTRA DS) 800-160 MG TABLET    Take 1 tablet by mouth 2 (two) times daily.     Jeanie SewerFawze, Shontell Prosser A, PA-C 04/29/17 1732    Mancel BaleWentz, Elliott, MD 04/29/17 [phone]

## 2017-04-29 NOTE — ED Triage Notes (Signed)
Patient c/o urinary frequency and lower abdominal pain x2 weeks. Hx UTI. Patient also c/o "skin growing over piercing" to left upper ear. Denies N/V/D.

## 2017-04-29 NOTE — Discharge Instructions (Signed)
Stay very well hydrated with plenty of water throughout the day. Take antibiotic in full course. Use pyridium as directed to decrease painful urination but know that a common side effect is to turn your urine a bright orange/red color. This is not a harmful side effect. Follow up with primary care physician in 1 week for recheck of ongoing symptoms but return to ER for emergent changing or worsening of symptoms. You may use triple antibiotic ointment on your ear additionally.

## 2017-04-29 NOTE — ED Notes (Signed)
Pt reporting swelling, pain, and discomfort on L ear after recent piercing.  Keloid scarring and redness/swelling visible, no pus or fever.  Ear cleaned, lidocaine used to numb cartilage and three piercings removed from L ear.  Left unbandaged to allow for drainage.  Pt given follow up care instructions.

## 2017-04-30 LAB — URINE CULTURE: Culture: NO GROWTH

## 2017-05-05 ENCOUNTER — Encounter (HOSPITAL_COMMUNITY): Payer: Self-pay

## 2017-05-05 ENCOUNTER — Emergency Department (HOSPITAL_COMMUNITY)
Admission: EM | Admit: 2017-05-05 | Discharge: 2017-05-05 | Disposition: A | Discharge status: Home / Self Care | Payer: Medicaid Other | Attending: Emergency Medicine | Admitting: Emergency Medicine

## 2017-05-05 DIAGNOSIS — A599 Trichomoniasis, unspecified: Secondary | ICD-10-CM

## 2017-05-05 DIAGNOSIS — N898 Other specified noninflammatory disorders of vagina: Secondary | ICD-10-CM | POA: Diagnosis present

## 2017-05-05 HISTORY — DX: Other specified bacterial agents as the cause of diseases classified elsewhere: B96.89

## 2017-05-05 HISTORY — DX: Other specified bacterial agents as the cause of diseases classified elsewhere: N76.0

## 2017-05-05 LAB — CBC
HEMATOCRIT: 37 % (ref 36.0–46.0)
Hemoglobin: 12.2 g/dL (ref 12.0–15.0)
MCH: 29.1 pg (ref 26.0–34.0)
MCHC: 33 g/dL (ref 30.0–36.0)
MCV: 88.3 fL (ref 78.0–100.0)
Platelets: 278 10*3/uL (ref 150–400)
RBC: 4.19 MIL/uL (ref 3.87–5.11)
RDW: 13.2 % (ref 11.5–15.5)
WBC: 6.4 10*3/uL (ref 4.0–10.5)

## 2017-05-05 LAB — URINALYSIS, ROUTINE W REFLEX MICROSCOPIC
BACTERIA UA: NONE SEEN
Bilirubin Urine: NEGATIVE
GLUCOSE, UA: NEGATIVE mg/dL
HGB URINE DIPSTICK: NEGATIVE
KETONES UR: NEGATIVE mg/dL
NITRITE: NEGATIVE
PH: 6 (ref 5.0–8.0)
PROTEIN: 30 mg/dL — AB
Specific Gravity, Urine: 1.034 — ABNORMAL HIGH (ref 1.005–1.030)

## 2017-05-05 LAB — COMPREHENSIVE METABOLIC PANEL
ALBUMIN: 3.9 g/dL (ref 3.5–5.0)
ALK PHOS: 71 U/L (ref 38–126)
ALT: 13 U/L — AB (ref 14–54)
AST: 18 U/L (ref 15–41)
Anion gap: 6 (ref 5–15)
BILIRUBIN TOTAL: 0.4 mg/dL (ref 0.3–1.2)
BUN: 9 mg/dL (ref 6–20)
CO2: 25 mmol/L (ref 22–32)
Calcium: 8.9 mg/dL (ref 8.9–10.3)
Chloride: 107 mmol/L (ref 101–111)
Creatinine, Ser: 0.88 mg/dL (ref 0.44–1.00)
GFR calc Af Amer: >60 mL/min (ref >60)
GFR calc non Af Amer: >60 mL/min (ref >60)
GLUCOSE: 90 mg/dL (ref 65–99)
POTASSIUM: 3.7 mmol/L (ref 3.5–5.1)
Sodium: 138 mmol/L (ref 135–145)
TOTAL PROTEIN: 6.5 g/dL (ref 6.5–8.1)

## 2017-05-05 LAB — I-STAT BETA HCG BLOOD, ED (MC, WL, AP ONLY): I-stat hCG, quantitative: <5 m[IU]/mL (ref <5)

## 2017-05-05 LAB — WET PREP, GENITAL
Clue Cells Wet Prep HPF POC: NONE SEEN
YEAST WET PREP: NONE SEEN

## 2017-05-05 LAB — LIPASE, BLOOD: Lipase: 28 U/L (ref 11–51)

## 2017-05-05 MED ORDER — METRONIDAZOLE 500 MG PO TABS
2000.0000 mg | ORAL_TABLET | Freq: Once | ORAL | Status: AC
  Administered 2017-05-05: 2000 mg via ORAL
  Filled 2017-05-05: qty 4

## 2017-05-05 MED ORDER — METRONIDAZOLE 500 MG PO TABS
500.0000 mg | ORAL_TABLET | Freq: Two times a day (BID) | ORAL | 0 refills | Status: DC
Start: 2017-05-05 — End: 2017-05-05

## 2017-05-05 NOTE — ED Notes (Signed)
Patient refuses to have blood work done.  MD aware

## 2017-05-05 NOTE — ED Provider Notes (Addendum)
[facility]-EMERGENCY DEPT Provider Note   CSN: [id number] Arrival date & time: 05/05/17  1733  By signing my name below, I, Modena Jansky, attest that this documentation has been prepared under the direction and in the presence of Rolan Bucco, MD. Electronically Signed: Modena Jansky, Scribe. 05/05/2017. 7:35 PM.  History   Chief Complaint Chief Complaint  Patient presents with  . Vaginal Discharge   The history is provided by the patient. No language interpreter was used.   HPI Comments: Tammy Sanchez is a 21 y.o. female who presents to the Emergency Department complaining of constant lower abdominal pain that started about a week ago. She states she was recently treated for a UTI without relief. She suspects bacterial vaginosis due to her prior hx. She reports associated dysuria and vaginal discharge. She admits to protected intercourse with males. Denies any hx of STD, fever, vomiting, or other complaints at this time.  Past Medical History:  Diagnosis Date  . Bacterial vaginosis     There are no active problems to display for this patient.   History reviewed. No pertinent surgical history.  OB History    No data available       Home Medications    Prior to Admission medications   Medication Sig Start Date End Date Taking? Authorizing Provider  fluticasone (FLONASE) 50 MCG/ACT nasal spray Place 2 sprays into both nostrils daily. Patient not taking: Reported on 08/14/2016 02/11/15 08/14/16  Truddie Coco, DO  ibuprofen (ADVIL,MOTRIN) 600 MG tablet Take 1 tablet (600 mg total) by mouth every 6 (six) hours as needed. 10/07/16   Eber Hong, MD  phenazopyridine (PYRIDIUM) 95 MG tablet Take 1 tablet (95 mg total) by mouth 3 (three) times daily as needed for pain. 04/29/17   Fawze, Mina A, PA-C  sulfamethoxazole-trimethoprim (BACTRIM DS,SEPTRA DS) 800-160 MG tablet Take 1 tablet by mouth 2 (two) times daily. 04/29/17 05/06/17  Michela Pitcher A, PA-C  traMADol (ULTRAM) 50 MG tablet Take 1  tablet (50 mg total) by mouth every 6 (six) hours as needed. 10/23/16   Janne Napoleon, NP    Family History No family history on file.  Social History Social History  Substance Use Topics  . Smoking status: Never Smoker  . Smokeless tobacco: Never Used  . Alcohol use No     Allergies   Patient has no known allergies.   Review of Systems Review of Systems  Constitutional: Negative for chills, diaphoresis, fatigue and fever.  HENT: Negative for congestion, rhinorrhea and sneezing.   Eyes: Negative.   Respiratory: Negative for cough, chest tightness and shortness of breath.   Cardiovascular: Negative for chest pain and leg swelling.  Gastrointestinal: Positive for abdominal pain. Negative for blood in stool, diarrhea, nausea and vomiting.  Genitourinary: Positive for dysuria and vaginal discharge. Negative for difficulty urinating, flank pain, frequency and hematuria.  Musculoskeletal: Negative for arthralgias and back pain.  Skin: Negative for rash.  Neurological: Negative for dizziness, speech difficulty, weakness, numbness and headaches.     Physical Exam Updated Vital Signs BP 110/66 (BP Location: Left Arm)   Pulse 76   Temp 98.4 F (36.9 C) (Oral)   Resp 18   LMP 04/12/2017   SpO2 99%   Physical Exam  Constitutional: She is oriented to person, place, and time. She appears well-developed and well-nourished.  HENT:  Head: Normocephalic and atraumatic.  Eyes: Pupils are equal, round, and reactive to light.  Neck: Normal range of motion. Neck supple.  Cardiovascular: Normal rate, regular  rhythm and normal heart sounds.   Pulmonary/Chest: Effort normal and breath sounds normal. No respiratory distress. She has no wheezes. She has no rales. She exhibits no tenderness.  Abdominal: Soft. Bowel sounds are normal. There is no tenderness. There is no rebound and no guarding.  Genitourinary: Vaginal discharge found.  Genitourinary Comments: Small amount of brownish vaginal  discharge, no bleeding, no cervical motion tenderness or adnexal tenderness  Musculoskeletal: Normal range of motion. She exhibits no edema.  Lymphadenopathy:    She has no cervical adenopathy.  Neurological: She is alert and oriented to person, place, and time.  Skin: Skin is warm and dry. No rash noted.  Psychiatric: She has a normal mood and affect.     ED Treatments / Results  DIAGNOSTIC STUDIES: Oxygen Saturation is 99% on RA, normal by my interpretation.    COORDINATION OF CARE: 7:39 PM- Pt advised of plan for treatment and pt agrees.  Labs (all labs ordered are listed, but only abnormal results are displayed) Labs Reviewed  WET PREP, GENITAL - Abnormal; Notable for the following:       Result Value   Trich, Wet Prep PRESENT (*)    WBC, Wet Prep HPF POC MANY (*)    All other components within normal limits  COMPREHENSIVE METABOLIC PANEL - Abnormal; Notable for the following:    ALT 13 (*)    All other components within normal limits  URINALYSIS, ROUTINE W REFLEX MICROSCOPIC - Abnormal; Notable for the following:    Specific Gravity, Urine 1.034 (*)    Protein, ur 30 (*)    Leukocytes, UA TRACE (*)    Squamous Epithelial / LPF 0-5 (*)    All other components within normal limits  LIPASE, BLOOD  CBC  I-STAT BETA HCG BLOOD, ED ([facility], [facility], [facility] ONLY)  GC/CHLAMYDIA PROBE AMP ([place]) NOT AT [facility]    EKG  EKG Interpretation None       Radiology No results found.  Procedures Procedures (including critical care time)  Medications Ordered in ED Medications  metroNIDAZOLE (FLAGYL) tablet 2,000 mg (not administered)     Initial Impression / Assessment and Plan / ED Course  I have reviewed the triage vital signs and the nursing notes.  Pertinent labs & imaging results that were available during my care of the patient were reviewed by me and considered in my medical decision making (see chart for details).     Patient presents with vaginal discharge. She is  positive for trichomonas. Other STDs are still pending. She did refuse the HIV and syphilis test that she did not want to get another blood draw. She was encouraged to follow-up with the women's outpatient clinic if her symptoms are not improving. She was started on Flagyl. She was advised that her partner needs to be treated as well.  Pt decided that she did not want to take a seven-day course of Flagyl and opted for the 2 g dose in the ED.  Final Clinical Impressions(s) / ED Diagnoses   Final diagnoses:  Trichimoniasis    New Prescriptions Current Discharge Medication List     I personally performed the services described in this documentation, which was scribed in my presence.  The recorded information has been reviewed and considered.     Rolan BuccoBelfi, Baker Kogler, MD 05/05/17 2030    Rolan BuccoBelfi, Dietrich Ke, MD 05/05/17 2036

## 2017-05-05 NOTE — ED Triage Notes (Signed)
Pt reports lower abdominal pain, described as a pressure, associated with foul smelling vaginal discharge "white colored cream like." She reports hx of BV.

## 2017-05-06 LAB — GC/CHLAMYDIA PROBE AMP (~~LOC~~) NOT AT ARMC
CHLAMYDIA, DNA PROBE: NEGATIVE
NEISSERIA GONORRHEA: NEGATIVE

## 2017-05-16 ENCOUNTER — Emergency Department (HOSPITAL_COMMUNITY)
Admission: EM | Admit: 2017-05-16 | Discharge: 2017-05-16 | Disposition: A | Discharge status: Home / Self Care | Payer: Medicaid Other | Attending: Emergency Medicine | Admitting: Emergency Medicine

## 2017-05-16 ENCOUNTER — Encounter (HOSPITAL_COMMUNITY): Payer: Self-pay

## 2017-05-16 DIAGNOSIS — B9689 Other specified bacterial agents as the cause of diseases classified elsewhere: Secondary | ICD-10-CM

## 2017-05-16 DIAGNOSIS — R109 Unspecified abdominal pain: Secondary | ICD-10-CM | POA: Diagnosis present

## 2017-05-16 DIAGNOSIS — M549 Dorsalgia, unspecified: Secondary | ICD-10-CM | POA: Diagnosis not present

## 2017-05-16 DIAGNOSIS — R102 Pelvic and perineal pain: Secondary | ICD-10-CM | POA: Insufficient documentation

## 2017-05-16 DIAGNOSIS — N760 Acute vaginitis: Secondary | ICD-10-CM | POA: Insufficient documentation

## 2017-05-16 LAB — URINALYSIS, ROUTINE W REFLEX MICROSCOPIC
BILIRUBIN URINE: NEGATIVE
Glucose, UA: NEGATIVE mg/dL
Hgb urine dipstick: NEGATIVE
KETONES UR: NEGATIVE mg/dL
Leukocytes, UA: NEGATIVE
NITRITE: NEGATIVE
Protein, ur: NEGATIVE mg/dL
Specific Gravity, Urine: 1.031 — ABNORMAL HIGH (ref 1.005–1.030)
pH: 5 (ref 5.0–8.0)

## 2017-05-16 LAB — BASIC METABOLIC PANEL
Anion gap: 6 (ref 5–15)
BUN: 9 mg/dL (ref 6–20)
CALCIUM: 9.1 mg/dL (ref 8.9–10.3)
CO2: 26 mmol/L (ref 22–32)
CREATININE: 0.83 mg/dL (ref 0.44–1.00)
Chloride: 104 mmol/L (ref 101–111)
GFR calc non Af Amer: >60 mL/min (ref >60)
Glucose, Bld: 140 mg/dL — ABNORMAL HIGH (ref 65–99)
Potassium: 3.5 mmol/L (ref 3.5–5.1)
SODIUM: 136 mmol/L (ref 135–145)

## 2017-05-16 LAB — WET PREP, GENITAL
Sperm: NONE SEEN
TRICH WET PREP: NONE SEEN
Yeast Wet Prep HPF POC: NONE SEEN

## 2017-05-16 LAB — POC URINE PREG, ED: PREG TEST UR: NEGATIVE

## 2017-05-16 MED ORDER — METRONIDAZOLE 375 MG PO CAPS: 750.0000 mg | ORAL_CAPSULE | Freq: Two times a day (BID) | ORAL | 0 refills | Status: AC

## 2017-05-16 MED ORDER — METRONIDAZOLE 500 MG PO TABS
750.0000 mg | ORAL_TABLET | Freq: Once | ORAL | Status: AC
  Administered 2017-05-16: 750 mg via ORAL
  Filled 2017-05-16: qty 2

## 2017-05-16 NOTE — ED Triage Notes (Signed)
Pt presents with continued suprapubic pain, dysuria and vaginal discharge (which is clear).  Pt was seen here, diagnosed with trich. She was treated for same; reports having intercourse 3 days later, but her partner was treated today.

## 2017-05-16 NOTE — Discharge Instructions (Signed)
Please take 2 tablets daily for the next 7 days. Please do not drink any alcohol way you're taking this medication because it can cause profuse vomiting. If you develop new or worsening symptoms including fever, chills, or worsening pelvic pain, please return to the emergency department. Please do not have sexual intercourse with any new partners until you have completed your course of antibiotics. Please note the ureter gonorrhea and chlamydia labs are pending, and if they are positive, someone from the lab will call you.

## 2017-05-16 NOTE — ED Provider Notes (Signed)
[facility] Provider Note   CSN: [id number] Arrival date & time: 05/16/17  1432     History   Chief Complaint Chief Complaint  Patient presents with  . Abdominal Pain    HPI Tammy Sanchez is a 21 y.o. female who presents to the emergency department with abdominal, pelvic, and back pain, dysuria, dyspareunia, and clear vaginal discharge. She reports that she was evaluated in the emergency department on 6/18 and diagnosed with trichomoniasis and was treated in the emergency department with metronidazole. She reports that her partner was not treated and they had sex 3 days later and now she is having the same symptoms as before. She denies fever, chills, vaginal odor, or N/V/D.   The history is provided by the patient. No language interpreter was used.    Past Medical History:  Diagnosis Date  . Bacterial vaginosis     There are no active problems to display for this patient.   History reviewed. No pertinent surgical history.  OB History    No data available       Home Medications    Prior to Admission medications   Medication Sig Start Date End Date Taking? Authorizing Provider  fluticasone (FLONASE) 50 MCG/ACT nasal spray Place 2 sprays into both nostrils daily. Patient not taking: Reported on 05/16/2017 02/11/15 05/16/17  Truddie CocoBush, Tamika, DO  ibuprofen (ADVIL,MOTRIN) 600 MG tablet Take 1 tablet (600 mg total) by mouth every 6 (six) hours as needed. Patient not taking: Reported on 05/16/2017 10/07/16   Eber HongMiller, Brian, MD  metronidazole (FLAGYL) 375 MG capsule Take 2 capsules (750 mg total) by mouth 2 (two) times daily. 05/16/17 05/23/17  Valjean Ruppel A, PA-C  phenazopyridine (PYRIDIUM) 95 MG tablet Take 1 tablet (95 mg total) by mouth 3 (three) times daily as needed for pain. Patient not taking: Reported on 05/16/2017 04/29/17   Michela PitcherFawze, Mina A, PA-C  traMADol (ULTRAM) 50 MG tablet Take 1 tablet (50 mg total) by mouth every 6 (six) hours as needed. Patient not taking:  Reported on 05/16/2017 10/23/16   Janne NapoleonNeese, Hope M, NP    Family History History reviewed. No pertinent family history.  Social History Social History  Substance Use Topics  . Smoking status: Never Smoker  . Smokeless tobacco: Never Used  . Alcohol use No     Allergies   Patient has no known allergies.   Review of Systems Review of Systems  Constitutional: Negative for activity change.  Respiratory: Negative for shortness of breath.   Cardiovascular: Negative for chest pain.  Gastrointestinal: Positive for abdominal pain. Negative for diarrhea, nausea and vomiting.  Genitourinary: Positive for dyspareunia, pelvic pain, vaginal discharge and vaginal pain. Negative for frequency, genital sores, hematuria and menstrual problem.  Musculoskeletal: Positive for back pain.  Skin: Negative for rash.   Physical Exam Updated Vital Signs BP 106/70   Pulse 89   Temp 98.4 F (36.9 C) (Oral)   Resp 18   LMP 05/14/2017 (Exact Date)   SpO2 100%   Physical Exam  Constitutional: No distress.  HENT:  Head: Normocephalic.  Eyes: Conjunctivae are normal.  Neck: Neck supple.  Cardiovascular: Normal rate, regular rhythm, normal heart sounds and intact distal pulses.  Exam reveals no gallop and no friction rub.   No murmur heard. Pulmonary/Chest: Effort normal. No respiratory distress. She has no wheezes. She has no rales. She exhibits no tenderness.  Abdominal: Soft. She exhibits no distension and no mass. There is tenderness. There is no guarding.  TTP of  the bilateral lower quadrants. No guarding or rebound.   Genitourinary: Uterus normal. There is no rash, tenderness or lesion on the right labia. There is no rash, tenderness or lesion on the left labia. Cervix exhibits no motion tenderness and no discharge. Right adnexum displays no mass, no tenderness and no fullness. Left adnexum displays no mass, no tenderness and no fullness. There is tenderness in the vagina. No vaginal discharge found.   Genitourinary Comments: Chaperoned pelvic exam. No cervical motion tenderness. No discharge is noted in the vaginal vault.  Musculoskeletal: Normal range of motion. She exhibits no tenderness.  Lymphadenopathy:       Right: No inguinal adenopathy present.       Left: No inguinal adenopathy present.  Neurological: She is alert.  Skin: Skin is warm. Capillary refill takes less than 2 seconds. No rash noted.  Psychiatric: Her behavior is normal.  Nursing note and vitals reviewed.  ED Treatments / Results  Labs (all labs ordered are listed, but only abnormal results are displayed) Labs Reviewed  WET PREP, GENITAL - Abnormal; Notable for the following:       Result Value   Clue Cells Wet Prep HPF POC PRESENT (*)    WBC, Wet Prep HPF POC MODERATE (*)    All other components within normal limits  URINALYSIS, ROUTINE W REFLEX MICROSCOPIC - Abnormal; Notable for the following:    APPearance HAZY (*)    Specific Gravity, Urine 1.031 (*)    All other components within normal limits  BASIC METABOLIC PANEL - Abnormal; Notable for the following:    Glucose, Bld 140 (*)    All other components within normal limits  HIV ANTIBODY (ROUTINE TESTING)  POC URINE PREG, ED  GC/CHLAMYDIA PROBE AMP ([place]) NOT AT [facility]    EKG  EKG Interpretation None       Radiology No results found.  Procedures Procedures (including critical care time)  Medications Ordered in ED Medications  metroNIDAZOLE (FLAGYL) tablet 750 mg (750 mg Oral Given 05/16/17 2330)     Initial Impression / Assessment and Plan / ED Course  I have reviewed the triage vital signs and the nursing notes.  Pertinent labs & imaging results that were available during my care of the patient were reviewed by me and considered in my medical decision making (see chart for details).     Patient with bacterial vaginosis on wet prep. HIV, gonorrhea and chlamydia pending. No cervical motion tenderness noted on physical exam.  Patient was given first dose of metronidazole in the emergency department. Encouraged using protection during sexual intercourse and ensuring that her sexual partner is also treated. Strict return precautions given. No acute distress. Vital signs stable. The patient is safe and stable for discharge at this time.  Final Clinical Impressions(s) / ED Diagnoses   Final diagnoses:  Bacterial vaginosis    New Prescriptions Discharge Medication List as of 05/16/2017 11:14 PM    START taking these medications   Details  metronidazole (FLAGYL) 375 MG capsule Take 2 capsules (750 mg total) by mouth 2 (two) times daily., Starting Fri 05/16/2017, Until Fri 05/23/2017, Print         Senie Lanese A, PA-C 05/17/17 Christophe Louis, MD 05/27/17 [phone]

## 2017-05-17 LAB — HIV ANTIBODY (ROUTINE TESTING W REFLEX): HIV SCREEN 4TH GENERATION: NONREACTIVE

## 2017-05-19 LAB — GC/CHLAMYDIA PROBE AMP (~~LOC~~) NOT AT ARMC
CHLAMYDIA, DNA PROBE: NEGATIVE
NEISSERIA GONORRHEA: NEGATIVE

## 2017-07-22 ENCOUNTER — Encounter (HOSPITAL_COMMUNITY): Payer: Self-pay

## 2017-07-22 DIAGNOSIS — R1030 Lower abdominal pain, unspecified: Secondary | ICD-10-CM | POA: Insufficient documentation

## 2017-07-22 DIAGNOSIS — Z202 Contact with and (suspected) exposure to infections with a predominantly sexual mode of transmission: Secondary | ICD-10-CM | POA: Insufficient documentation

## 2017-07-22 DIAGNOSIS — Z79899 Other long term (current) drug therapy: Secondary | ICD-10-CM | POA: Diagnosis not present

## 2017-07-22 DIAGNOSIS — N898 Other specified noninflammatory disorders of vagina: Secondary | ICD-10-CM | POA: Diagnosis present

## 2017-07-22 DIAGNOSIS — Z791 Long term (current) use of non-steroidal anti-inflammatories (NSAID): Secondary | ICD-10-CM | POA: Diagnosis not present

## 2017-07-22 LAB — URINALYSIS, ROUTINE W REFLEX MICROSCOPIC
BILIRUBIN URINE: NEGATIVE
Glucose, UA: NEGATIVE mg/dL
Hgb urine dipstick: NEGATIVE
Ketones, ur: NEGATIVE mg/dL
Leukocytes, UA: NEGATIVE
Nitrite: NEGATIVE
PH: 8 (ref 5.0–8.0)
Protein, ur: 30 mg/dL — AB
RBC / HPF: NONE SEEN RBC/hpf (ref 0–5)
SPECIFIC GRAVITY, URINE: 1.021 (ref 1.005–1.030)

## 2017-07-22 LAB — COMPREHENSIVE METABOLIC PANEL
ALK PHOS: 69 U/L (ref 38–126)
ALT: 9 U/L — AB (ref 14–54)
AST: 17 U/L (ref 15–41)
Albumin: 4 g/dL (ref 3.5–5.0)
Anion gap: 7 (ref 5–15)
BILIRUBIN TOTAL: 0.7 mg/dL (ref 0.3–1.2)
BUN: 5 mg/dL — ABNORMAL LOW (ref 6–20)
CALCIUM: 9.2 mg/dL (ref 8.9–10.3)
CO2: 27 mmol/L (ref 22–32)
CREATININE: 0.7 mg/dL (ref 0.44–1.00)
Chloride: 106 mmol/L (ref 101–111)
Glucose, Bld: 90 mg/dL (ref 65–99)
Potassium: 4 mmol/L (ref 3.5–5.1)
Sodium: 140 mmol/L (ref 135–145)
TOTAL PROTEIN: 6.7 g/dL (ref 6.5–8.1)

## 2017-07-22 LAB — CBC
HCT: 35.9 % — ABNORMAL LOW (ref 36.0–46.0)
Hemoglobin: 11.8 g/dL — ABNORMAL LOW (ref 12.0–15.0)
MCH: 29 pg (ref 26.0–34.0)
MCHC: 32.9 g/dL (ref 30.0–36.0)
MCV: 88.2 fL (ref 78.0–100.0)
PLATELETS: 266 10*3/uL (ref 150–400)
RBC: 4.07 MIL/uL (ref 3.87–5.11)
RDW: 13.9 % (ref 11.5–15.5)
WBC: 5 10*3/uL (ref 4.0–10.5)

## 2017-07-22 LAB — LIPASE, BLOOD: LIPASE: 27 U/L (ref 11–51)

## 2017-07-22 LAB — I-STAT BETA HCG BLOOD, ED (MC, WL, AP ONLY): I-stat hCG, quantitative: <5 m[IU]/mL (ref <5)

## 2017-07-22 NOTE — ED Triage Notes (Signed)
Pt reports she has had lower abd cramping that started 2 days ago. She reports she recently was treated for chlamydia but feels she has persistent symptoms. Denies n/v/d.

## 2017-07-22 NOTE — ED Notes (Signed)
Patient didn't respond when called to have vital signs reassessed.

## 2017-07-23 ENCOUNTER — Emergency Department (HOSPITAL_COMMUNITY): Payer: Medicaid Other

## 2017-07-23 ENCOUNTER — Emergency Department (HOSPITAL_COMMUNITY)
Admission: EM | Admit: 2017-07-23 | Discharge: 2017-07-23 | Disposition: A | Discharge status: Home / Self Care | Payer: Medicaid Other | Attending: Emergency Medicine | Admitting: Emergency Medicine

## 2017-07-23 ENCOUNTER — Encounter (HOSPITAL_COMMUNITY): Payer: Self-pay | Admitting: *Deleted

## 2017-07-23 ENCOUNTER — Emergency Department (HOSPITAL_COMMUNITY)
Admission: EM | Admit: 2017-07-23 | Discharge: 2017-07-23 | Disposition: A | Discharge status: Home / Self Care | Payer: Medicaid Other | Source: Home / Self Care | Attending: Emergency Medicine | Admitting: Emergency Medicine

## 2017-07-23 DIAGNOSIS — R1030 Lower abdominal pain, unspecified: Secondary | ICD-10-CM

## 2017-07-23 DIAGNOSIS — R102 Pelvic and perineal pain unspecified side: Secondary | ICD-10-CM

## 2017-07-23 DIAGNOSIS — N898 Other specified noninflammatory disorders of vagina: Secondary | ICD-10-CM

## 2017-07-23 DIAGNOSIS — Z202 Contact with and (suspected) exposure to infections with a predominantly sexual mode of transmission: Secondary | ICD-10-CM

## 2017-07-23 LAB — CBC WITH DIFFERENTIAL/PLATELET
BASOS ABS: 0 10*3/uL (ref 0.0–0.1)
Basophils Relative: 0 %
Eosinophils Absolute: 0.1 10*3/uL (ref 0.0–0.7)
Eosinophils Relative: 2 %
HEMATOCRIT: 38.1 % (ref 36.0–46.0)
Hemoglobin: 12.4 g/dL (ref 12.0–15.0)
LYMPHS ABS: 2.3 10*3/uL (ref 0.7–4.0)
LYMPHS PCT: 40 %
MCH: 29 pg (ref 26.0–34.0)
MCHC: 32.5 g/dL (ref 30.0–36.0)
MCV: 89 fL (ref 78.0–100.0)
Monocytes Absolute: 0.4 10*3/uL (ref 0.1–1.0)
Monocytes Relative: 7 %
NEUTROS ABS: 2.9 10*3/uL (ref 1.7–7.7)
Neutrophils Relative %: 51 %
Platelets: 270 10*3/uL (ref 150–400)
RBC: 4.28 MIL/uL (ref 3.87–5.11)
RDW: 13.7 % (ref 11.5–15.5)
WBC: 5.8 10*3/uL (ref 4.0–10.5)

## 2017-07-23 LAB — URINALYSIS, ROUTINE W REFLEX MICROSCOPIC
Bilirubin Urine: NEGATIVE
GLUCOSE, UA: NEGATIVE mg/dL
Hgb urine dipstick: NEGATIVE
Ketones, ur: NEGATIVE mg/dL
LEUKOCYTES UA: NEGATIVE
Nitrite: NEGATIVE
PROTEIN: NEGATIVE mg/dL
SPECIFIC GRAVITY, URINE: 1.026 (ref 1.005–1.030)
pH: 5 (ref 5.0–8.0)

## 2017-07-23 LAB — WET PREP, GENITAL
Clue Cells Wet Prep HPF POC: NONE SEEN
Sperm: NONE SEEN
Trich, Wet Prep: NONE SEEN
YEAST WET PREP: NONE SEEN

## 2017-07-23 MED ORDER — ACETAMINOPHEN 325 MG PO TABS
650.0000 mg | ORAL_TABLET | Freq: Once | ORAL | Status: AC
  Administered 2017-07-23: 650 mg via ORAL
  Filled 2017-07-23: qty 2

## 2017-07-23 MED ORDER — DOXYCYCLINE HYCLATE 100 MG PO CAPS: 100.0000 mg | ORAL_CAPSULE | Freq: Two times a day (BID) | ORAL | 0 refills | Status: DC

## 2017-07-23 MED ORDER — IBUPROFEN 200 MG PO TABS
600.0000 mg | ORAL_TABLET | Freq: Once | ORAL | Status: AC
  Administered 2017-07-23: 600 mg via ORAL
  Filled 2017-07-23: qty 1

## 2017-07-23 MED ORDER — AZITHROMYCIN 250 MG PO TABS
1000.0000 mg | ORAL_TABLET | Freq: Once | ORAL | Status: AC
  Administered 2017-07-23: 1000 mg via ORAL
  Filled 2017-07-23: qty 4

## 2017-07-23 MED ORDER — KETOROLAC TROMETHAMINE 30 MG/ML IJ SOLN
30.0000 mg | Freq: Once | INTRAMUSCULAR | Status: DC
  Filled 2017-07-23: qty 1

## 2017-07-23 MED ORDER — LIDOCAINE HCL (PF) 1 % IJ SOLN
2.0000 mL | Freq: Once | INTRAMUSCULAR | Status: AC
  Administered 2017-07-23: 2 mL
  Filled 2017-07-23: qty 5

## 2017-07-23 MED ORDER — CEFTRIAXONE SODIUM 250 MG IJ SOLR
250.0000 mg | Freq: Once | INTRAMUSCULAR | Status: AC
  Administered 2017-07-23: 250 mg via INTRAMUSCULAR
  Filled 2017-07-23: qty 250

## 2017-07-23 NOTE — ED Notes (Signed)
Patient transported to Ultrasound 

## 2017-07-23 NOTE — ED Notes (Signed)
Called for a room 2x with no answer.

## 2017-07-23 NOTE — Discharge Instructions (Signed)
Have discussed her findings with you concerning her ultrasound including signs of a hydrosalpinx. Had been treated for STD in the ED. We'll need to continue taking the doxycycline twice a day for 14 days to treat pelvic inflammatory disease. Motrin and Tylenol for pain. He will need [facility]. They should call you with an appointment. Have given you their number in case you do not hear from them today. Return to the ED if he developed worsening pain, fevers, vomiting or for any reason.

## 2017-07-23 NOTE — ED Provider Notes (Signed)
[facility] DEPT Provider Note   CSN: [id number] Arrival date & time: 07/23/17  0756     History   Chief Complaint Chief Complaint  Patient presents with  . Abdominal Pain  . SEXUALLY TRANSMITTED DISEASE    HPI Tammy Sanchez is a 21 y.o. female.  HPI 21 year old African-American female presents to the ED with complaints of vaginal discharge, suprapubic abdominal pain, pelvic pain. The patient states that she started developing her symptoms 2 days ago. States that 2 weeks ago she was treated for chlamydia with IV Rocephin and azithromycin. States that she had sexual intercourse 10 days later. Symptoms returned 2 days ago. The patient states that she is sexually active with multiple partners. Denies using protection. Reports foul-smelling brown discharge. Denies any vaginal bleeding. States the pain is cramping in nature. She has not taken anything for the pain prior to arrival. Nothing makes better or worse.  Pt denies any fever, chill, ha, vision changes, lightheadedness, dizziness, congestion, neck pain, cp, sob, cough, n/v/d, urinary symptoms, change in bowel habits, melena, hematochezia, lower extremity paresthesias.  Past Medical History:  Diagnosis Date  . Bacterial vaginosis     There are no active problems to display for this patient.   History reviewed. No pertinent surgical history.  OB History    No data available       Home Medications    Prior to Admission medications   Medication Sig Start Date End Date Taking? Authorizing Provider  doxycycline (VIBRAMYCIN) 100 MG capsule Take 1 capsule (100 mg total) by mouth 2 (two) times daily. 07/23/17   Rise Mu, PA-C  fluticasone (FLONASE) 50 MCG/ACT nasal spray Place 2 sprays into both nostrils daily. Patient not taking: Reported on 05/16/2017 02/11/15 05/16/17  Truddie Coco, DO  ibuprofen (ADVIL,MOTRIN) 600 MG tablet Take 1 tablet (600 mg total) by mouth every 6 (six) hours as needed. Patient not taking:  Reported on 05/16/2017 10/07/16   Eber Hong, MD  phenazopyridine (PYRIDIUM) 95 MG tablet Take 1 tablet (95 mg total) by mouth 3 (three) times daily as needed for pain. Patient not taking: Reported on 05/16/2017 04/29/17   Michela Pitcher A, PA-C  traMADol (ULTRAM) 50 MG tablet Take 1 tablet (50 mg total) by mouth every 6 (six) hours as needed. Patient not taking: Reported on 05/16/2017 10/23/16   Janne Napoleon, NP    Family History History reviewed. No pertinent family history.  Social History Social History  Substance Use Topics  . Smoking status: Never Smoker  . Smokeless tobacco: Never Used  . Alcohol use No     Allergies   Patient has no known allergies.   Review of Systems Review of Systems  Constitutional: Negative for chills and fever.  HENT: Negative for congestion.   Eyes: Negative for visual disturbance.  Respiratory: Negative for cough and shortness of breath.   Cardiovascular: Negative for chest pain.  Gastrointestinal: Positive for abdominal pain. Negative for diarrhea, nausea and vomiting.  Genitourinary: Positive for pelvic pain and vaginal discharge. Negative for dysuria, flank pain, frequency, hematuria, urgency and vaginal bleeding.  Musculoskeletal: Negative for arthralgias and myalgias.  Skin: Negative for rash.  Neurological: Negative for dizziness, syncope, weakness, light-headedness, numbness and headaches.  Psychiatric/Behavioral: Negative for sleep disturbance. The patient is not nervous/anxious.      Physical Exam Updated Vital Signs BP 113/66   Pulse 63   Temp 97.8 F (36.6 C) (Oral)   Resp 16   LMP 07/18/2017 (Within Days)   SpO2 100%  Physical Exam  Constitutional: She is oriented to person, place, and time. She appears well-developed and well-nourished.  Non-toxic appearance. No distress.  HENT:  Head: Normocephalic and atraumatic.  Nose: Nose normal.  Mouth/Throat: Oropharynx is clear and moist.  Eyes: Pupils are equal, round, and  reactive to light. Conjunctivae are normal. Right eye exhibits no discharge. Left eye exhibits no discharge.  Neck: Normal range of motion. Neck supple.  Cardiovascular: Normal rate, regular rhythm, normal heart sounds and intact distal pulses.   Pulmonary/Chest: Effort normal and breath sounds normal. No respiratory distress. She exhibits no tenderness.  Abdominal: Soft. Bowel sounds are normal. There is tenderness in the suprapubic area. There is no rigidity, no rebound, no guarding, no CVA tenderness, no tenderness at McBurney's point and negative Murphy's sign.  Genitourinary:  Genitourinary Comments: Chaperone present for exam. No external lesions, swelling, erythema, or rash of the labia. Brown discharge noted. No erythema, bleeding, or lesions noted in the vaginal vault. CMT tenderness. No bleeding or friability. Left adnexal tenderness with fullness. No right adnexal tnednerss bilaterally. No inguinal adenopathy or hernia.    Musculoskeletal: Normal range of motion. She exhibits no tenderness.  Lymphadenopathy:    She has no cervical adenopathy.  Neurological: She is alert and oriented to person, place, and time.  Skin: Skin is warm and dry. Capillary refill takes less than 2 seconds.  Psychiatric: Her behavior is normal. Judgment and thought content normal.  Nursing note and vitals reviewed.    ED Treatments / Results  Labs (all labs ordered are listed, but only abnormal results are displayed) Labs Reviewed  WET PREP, GENITAL - Abnormal; Notable for the following:       Result Value   WBC, Wet Prep HPF POC MODERATE (*)    All other components within normal limits  URINALYSIS, ROUTINE W REFLEX MICROSCOPIC - Abnormal; Notable for the following:    APPearance HAZY (*)    All other components within normal limits  CBC WITH DIFFERENTIAL/PLATELET  GC/CHLAMYDIA PROBE AMP ([place]) NOT AT [facility]    EKG  EKG Interpretation None       Radiology US Pelvis Transvanginal Non-ob  (tv Only)  Result Date: 07/23/2017 CLINICAL DATA:  General pelvic pain. EXAM: TRANSABDOMINAL AND TRANSVAGINAL ULTRASOUND OF PELVIS DOPPLER ULTRASOUND OF OVARIES TECHNIQUE: Both transabdominal and transvaginal ultrasound examinations of the pelvis were performed. Transabdominal technique was performed for global imaging of the pelvis including uterus, ovaries, adnexal regions, and pelvic cul-de-sac. It was necessary to proceed with endovaginal exam following the transabdominal exam to visualize the endometrium. Color and duplex Doppler ultrasound was utilized to evaluate blood flow to the ovaries. COMPARISON:  None. FINDINGS: Uterus Measurements: 6.8 x 4.3 x 4.7 cm. Uterus is retroverted. No fibroids or other mass visualized. Endometrium Thickness: 14 mm. Heterogeneous without focal abnormality visualized. Right ovary Measurements: 3.6 x 2.1 x 4.2 cm. Normal appearance/no adnexal mass. Left ovary Measurements: 4.6 x 2.9 x 3.4 cm. Normal appearance. There is a tubular, fluid-filled structure in the left adnexa containing thickened, somewhat nodular longitudinal folds and incomplete septae. Pulsed Doppler evaluation of both ovaries demonstrates normal low-resistance arterial and venous waveforms. Other findings No abnormal free fluid. IMPRESSION: 1. Tubular, fluid-filled structure in the left adnexa contain thickened, somewhat nodular folds, suspicious for hydrosalpinx. Consider further evaluation with pelvic MRI as clinically indicated. 2. No sonographic evidence of ovarian torsion. Electronically Signed   By: Obie Dredge M.D.   On: 07/23/2017 12:27   US Pelvis (transabdominal Only)  Result Date: 07/23/2017 CLINICAL DATA:  General pelvic pain. EXAM: TRANSABDOMINAL AND TRANSVAGINAL ULTRASOUND OF PELVIS DOPPLER ULTRASOUND OF OVARIES TECHNIQUE: Both transabdominal and transvaginal ultrasound examinations of the pelvis were performed. Transabdominal technique was performed for global imaging of the pelvis  including uterus, ovaries, adnexal regions, and pelvic cul-de-sac. It was necessary to proceed with endovaginal exam following the transabdominal exam to visualize the endometrium. Color and duplex Doppler ultrasound was utilized to evaluate blood flow to the ovaries. COMPARISON:  None. FINDINGS: Uterus Measurements: 6.8 x 4.3 x 4.7 cm. Uterus is retroverted. No fibroids or other mass visualized. Endometrium Thickness: 14 mm. Heterogeneous without focal abnormality visualized. Right ovary Measurements: 3.6 x 2.1 x 4.2 cm. Normal appearance/no adnexal mass. Left ovary Measurements: 4.6 x 2.9 x 3.4 cm. Normal appearance. There is a tubular, fluid-filled structure in the left adnexa containing thickened, somewhat nodular longitudinal folds and incomplete septae. Pulsed Doppler evaluation of both ovaries demonstrates normal low-resistance arterial and venous waveforms. Other findings No abnormal free fluid. IMPRESSION: 1. Tubular, fluid-filled structure in the left adnexa contain thickened, somewhat nodular folds, suspicious for hydrosalpinx. Consider further evaluation with pelvic MRI as clinically indicated. 2. No sonographic evidence of ovarian torsion. Electronically Signed   By: Obie DredgeWilliam T Derry M.D.   On: 07/23/2017 12:27   [place] Pelvic Doppler (torsion R/o Or Mass Arterial Flow)  Result Date: 07/23/2017 CLINICAL DATA:  General pelvic pain. EXAM: TRANSABDOMINAL AND TRANSVAGINAL ULTRASOUND OF PELVIS DOPPLER ULTRASOUND OF OVARIES TECHNIQUE: Both transabdominal and transvaginal ultrasound examinations of the pelvis were performed. Transabdominal technique was performed for global imaging of the pelvis including uterus, ovaries, adnexal regions, and pelvic cul-de-sac. It was necessary to proceed with endovaginal exam following the transabdominal exam to visualize the endometrium. Color and duplex Doppler ultrasound was utilized to evaluate blood flow to the ovaries. COMPARISON:  None. FINDINGS: Uterus Measurements: 6.8  x 4.3 x 4.7 cm. Uterus is retroverted. No fibroids or other mass visualized. Endometrium Thickness: 14 mm. Heterogeneous without focal abnormality visualized. Right ovary Measurements: 3.6 x 2.1 x 4.2 cm. Normal appearance/no adnexal mass. Left ovary Measurements: 4.6 x 2.9 x 3.4 cm. Normal appearance. There is a tubular, fluid-filled structure in the left adnexa containing thickened, somewhat nodular longitudinal folds and incomplete septae. Pulsed Doppler evaluation of both ovaries demonstrates normal low-resistance arterial and venous waveforms. Other findings No abnormal free fluid. IMPRESSION: 1. Tubular, fluid-filled structure in the left adnexa contain thickened, somewhat nodular folds, suspicious for hydrosalpinx. Consider further evaluation with pelvic MRI as clinically indicated. 2. No sonographic evidence of ovarian torsion. Electronically Signed   By: Obie DredgeWilliam T Derry M.D.   On: 07/23/2017 12:27    Procedures Procedures (including critical care time)  Medications Ordered in ED Medications  ibuprofen (ADVIL,MOTRIN) tablet 600 mg (600 mg Oral Given 07/23/17 0851)  acetaminophen (TYLENOL) tablet 650 mg (650 mg Oral Given 07/23/17 0851)  cefTRIAXone (ROCEPHIN) injection 250 mg (250 mg Intramuscular Given 07/23/17 1101)  azithromycin (ZITHROMAX) tablet 1,000 mg (1,000 mg Oral Given 07/23/17 1100)  lidocaine (PF) (XYLOCAINE) 1 % injection 2 mL (2 mLs Other Given 07/23/17 1100)     Initial Impression / Assessment and Plan / ED Course  I have reviewed the triage vital signs and the nursing notes.  Pertinent labs & imaging results that were available during my care of the patient were reviewed by me and considered in my medical decision making (see chart for details).     Patient presents to the ED with complaints of vaginal discharge, suprapubic  abdominal pain. Was recently treated for chlamydia. The patient denies any associated fevers, chills, nausea, vomiting, vaginal bleeding, change in bowel  habits. Patient presented to the ED yesterday evening however was unable to stay due to the wait. Had blood work drawn at that time. Which was reviewed by myself.  Patient is overall well-appearing and nontoxic. Vital signs are reassuring. Patient is afebrile, no tachycardia.  Lab work yesterday reveals no leukocytosis. All other lab work is reassuring. UA shows no signs of infection. Negative pregnancy test.  Repeat blood work today shows no leukocytosis. A pelvic exam was performed that showed a vaginal discharge with cervical motion tenderness and left adnexal tenderness. Wet prep reveals many wbc's but no other abnormalities. UA today shows no signs of infection. GC and chlamydia cultures are pending.  Ultrasound of the pelvis was ordered to rule out abscess. Ultrasound does note a fluid-filled structure left adnexa concerning for hydrosalpinx.   Discussed case with Dr. Vergie Living  with OB/GYN. He recommends outpatient trial of doxycycline and follow-up in their clinic in 3 days. Patient given I am Rocephin and azithromycin the ED today. Will be discharged home on doxycycline twice a day. Repeat abdominal exam is benign.   Pt is hemodynamically stable, in NAD, & able to ambulate in the ED. Evaluation does not show pathology that would require ongoing emergent intervention or inpatient treatment. I explained the diagnosis to the patient. Pain has been managed & has no complaints prior to dc. Pt is comfortable with above plan and is stable for discharge at this time. All questions were answered prior to disposition. Strict return precautions for f/u to the ED were discussed. Encouraged follow up with PCP.  Dicussed with dr. Denton Lank who is agreeable to the above plan    Final Clinical Impressions(s) / ED Diagnoses   Final diagnoses:  Vaginal discharge  Lower abdominal pain  STD exposure    New Prescriptions New Prescriptions   DOXYCYCLINE (VIBRAMYCIN) 100 MG CAPSULE    Take 1 capsule (100 mg  total) by mouth 2 (two) times daily.     Rise Mu, PA-C 07/23/17 1358    Cathren Laine, MD 07/24/17 [phone]

## 2017-07-23 NOTE — ED Notes (Signed)
Tyler PA at bedside   

## 2017-07-23 NOTE — ED Notes (Signed)
Pt would prefer motrin over the IM injection of toradol. [facility].

## 2017-07-23 NOTE — ED Triage Notes (Signed)
Pt reports recently being treated for std, still has lower abd pain and brown discharge. Was here yesterday and had blood work done but left prior to being seen.

## 2017-07-23 NOTE — ED Notes (Signed)
Pt ambulated to restroom without difficulty

## 2017-07-23 NOTE — ED Notes (Signed)
Pt getting dressed.

## 2017-07-24 ENCOUNTER — Telehealth: Payer: Self-pay | Admitting: General Practice

## 2017-07-24 LAB — GC/CHLAMYDIA PROBE AMP (~~LOC~~) NOT AT ARMC
Chlamydia: NEGATIVE
Neisseria Gonorrhea: NEGATIVE

## 2017-07-24 NOTE — Telephone Encounter (Signed)
Called and left message on VM in regards to appointment with Dr. Vergie LivingPickens on Friday, 07/25/17 at 11:00am.  Asked patient to give our office a call if unable to keep appointment.

## 2017-07-25 ENCOUNTER — Ambulatory Visit: Payer: Medicaid Other | Admitting: Obstetrics and Gynecology

## 2017-07-26 ENCOUNTER — Encounter (HOSPITAL_COMMUNITY): Payer: Self-pay | Admitting: *Deleted

## 2017-07-26 ENCOUNTER — Emergency Department (HOSPITAL_COMMUNITY)
Admission: EM | Admit: 2017-07-26 | Discharge: 2017-07-27 | Disposition: A | Discharge status: Home / Self Care | Payer: Medicaid Other | Attending: Emergency Medicine | Admitting: Emergency Medicine

## 2017-07-26 DIAGNOSIS — N898 Other specified noninflammatory disorders of vagina: Secondary | ICD-10-CM | POA: Diagnosis present

## 2017-07-26 DIAGNOSIS — B9689 Other specified bacterial agents as the cause of diseases classified elsewhere: Secondary | ICD-10-CM | POA: Insufficient documentation

## 2017-07-26 DIAGNOSIS — N760 Acute vaginitis: Secondary | ICD-10-CM | POA: Diagnosis not present

## 2017-07-26 NOTE — ED Triage Notes (Signed)
The pt was seen here 2 days for lower abd pain and brown vaginal discharge.  She was given antibiotics and since the she has a vaginal discharge and she thinks the antibiotics are causing it.  She has irritaTION AND SWELLING  LMP sept first

## 2017-07-27 LAB — WET PREP, GENITAL
SPERM: NONE SEEN
Trich, Wet Prep: NONE SEEN
YEAST WET PREP: NONE SEEN

## 2017-07-27 MED ORDER — METRONIDAZOLE 500 MG PO TABS: 500.0000 mg | ORAL_TABLET | Freq: Two times a day (BID) | ORAL | 0 refills | Status: DC

## 2017-07-27 NOTE — ED Provider Notes (Signed)
[facility] Provider Note   CSN: [id number] Arrival date & time: 07/26/17  1855     History   Chief Complaint Chief Complaint  Patient presents with  . Vaginal Discharge    HPI Tammy Sanchez is a 21 y.o. female.  HPI Patient is a 21 year old female presents emergency department complaining of new vaginal discharge over the past 2 days.  She was recently seen in emergency department and given Rocephin and azithromycin and started on Doxil for possible PID.  She underwent ultrasound that time which demonstrated no signs to suggest tubo-ovarian abscess.  She denies abdominal pain.  No fevers or chills.  Symptoms are mild in severity.   Past Medical History:  Diagnosis Date  . Bacterial vaginosis     There are no active problems to display for this patient.   No past surgical history on file.  OB History    No data available       Home Medications    Prior to Admission medications   Medication Sig Start Date End Date Taking? Authorizing Provider  doxycycline (VIBRAMYCIN) 100 MG capsule Take 1 capsule (100 mg total) by mouth 2 (two) times daily. 07/23/17   Rise Mu, PA-C  fluticasone (FLONASE) 50 MCG/ACT nasal spray Place 2 sprays into both nostrils daily. Patient not taking: Reported on 05/16/2017 02/11/15 05/16/17  Truddie Coco, DO  ibuprofen (ADVIL,MOTRIN) 600 MG tablet Take 1 tablet (600 mg total) by mouth every 6 (six) hours as needed. Patient not taking: Reported on 05/16/2017 10/07/16   Eber Hong, MD  metroNIDAZOLE (FLAGYL) 500 MG tablet Take 1 tablet (500 mg total) by mouth 2 (two) times daily. 07/27/17   Azalia Bilis, MD  phenazopyridine (PYRIDIUM) 95 MG tablet Take 1 tablet (95 mg total) by mouth 3 (three) times daily as needed for pain. Patient not taking: Reported on 05/16/2017 04/29/17   Michela Pitcher A, PA-C  traMADol (ULTRAM) 50 MG tablet Take 1 tablet (50 mg total) by mouth every 6 (six) hours as needed. Patient not taking: Reported on 05/16/2017  10/23/16   Janne Napoleon, NP    Family History No family history on file.  Social History Social History  Substance Use Topics  . Smoking status: Never Smoker  . Smokeless tobacco: Never Used  . Alcohol use No     Allergies   Patient has no known allergies.   Review of Systems Review of Systems  All other systems reviewed and are negative.    Physical Exam Updated Vital Signs BP 109/64   Pulse 70   Temp 99.1 F (37.3 C) (Oral)   Resp 16   Ht  (1.549 m)   Wt 50.8 kg (112 lb)   LMP 07/18/2017 (Within Days)   SpO2 100%   BMI 21.16 kg/m   Physical Exam  Constitutional: She is oriented to person, place, and time. She appears well-developed and well-nourished. No distress.  HENT:  Head: Normocephalic and atraumatic.  Eyes: EOM are normal.  Neck: Normal range of motion.  Cardiovascular: Normal rate and regular rhythm.   Pulmonary/Chest: Effort normal.  Abdominal: Soft. She exhibits no distension. There is no tenderness.  Genitourinary:  Genitourinary Comments: Vaginal exam performed by my student.  No cervical motion tenderness.  No adnexal masses or fullness.  Scant vaginal discharge.  No bleeding  Musculoskeletal: Normal range of motion.  Neurological: She is alert and oriented to person, place, and time.  Skin: Skin is warm and dry.  Psychiatric: She has a  normal mood and affect. Judgment normal.  Nursing note and vitals reviewed.    ED Treatments / Results  Labs (all labs ordered are listed, but only abnormal results are displayed) Labs Reviewed  WET PREP, GENITAL - Abnormal; Notable for the following:       Result Value   Clue Cells Wet Prep HPF POC PRESENT (*)    WBC, Wet Prep HPF POC MANY (*)    All other components within normal limits    EKG  EKG Interpretation None       Radiology No results found.  Procedures Procedures (including critical care time)  Medications Ordered in ED Medications - No data to display   Initial  Impression / Assessment and Plan / ED Course  I have reviewed the triage vital signs and the nursing notes.  Pertinent labs & imaging results that were available during my care of the patient were reviewed by me and considered in my medical decision making (see chart for details).     Patient treated for bacterial vaginosis.  GYN follow-up  Final Clinical Impressions(s) / ED Diagnoses   Final diagnoses:  BV (bacterial vaginosis)    New Prescriptions New Prescriptions   METRONIDAZOLE (FLAGYL) 500 MG TABLET    Take 1 tablet (500 mg total) by mouth 2 (two) times daily.     Azalia Bilisampos, Vy Badley, MD 07/27/17 [phone]

## 2017-07-27 NOTE — ED Notes (Signed)
Pt understood dc material. NAD noted. Script given at dc  

## 2017-07-29 MED ORDER — METRONIDAZOLE 500 MG PO TABS: 500.0000 mg | ORAL_TABLET | Freq: Two times a day (BID) | ORAL | 0 refills | Status: DC

## 2017-07-29 NOTE — ED Provider Notes (Signed)
Notified that pt is here because she lost her rx for flagyl. Labs, note from recent visit reviewed. Flagyl Rx provided.   Shaune PollackIsaacs, Xander Jutras, MD 07/29/17 1054

## 2017-07-31 ENCOUNTER — Ambulatory Visit: Payer: Medicaid Other | Admitting: Obstetrics and Gynecology

## 2017-08-01 ENCOUNTER — Encounter: Payer: Self-pay | Admitting: Obstetrics and Gynecology

## 2017-08-01 NOTE — Progress Notes (Signed)
Patient did not keep GYN ED follow up appointment for 07/31/2017.  Cornelia Copa MD Attending Center for [employer] [facility])

## 2017-08-02 ENCOUNTER — Encounter (HOSPITAL_COMMUNITY): Payer: Self-pay | Admitting: *Deleted

## 2017-08-02 ENCOUNTER — Inpatient Hospital Stay (HOSPITAL_COMMUNITY)
Admission: AD | Admit: 2017-08-02 | Discharge: 2017-08-02 | Disposition: A | Discharge status: Home / Self Care | Payer: Medicaid Other | Source: Ambulatory Visit | Attending: Obstetrics and Gynecology | Admitting: Obstetrics and Gynecology

## 2017-08-02 DIAGNOSIS — G8929 Other chronic pain: Secondary | ICD-10-CM | POA: Diagnosis not present

## 2017-08-02 DIAGNOSIS — R1031 Right lower quadrant pain: Secondary | ICD-10-CM | POA: Diagnosis present

## 2017-08-02 LAB — URINALYSIS, ROUTINE W REFLEX MICROSCOPIC
Bilirubin Urine: NEGATIVE
GLUCOSE, UA: NEGATIVE mg/dL
HGB URINE DIPSTICK: NEGATIVE
Ketones, ur: NEGATIVE mg/dL
Nitrite: NEGATIVE
Protein, ur: NEGATIVE mg/dL
SPECIFIC GRAVITY, URINE: 1.013 (ref 1.005–1.030)
pH: 8 (ref 5.0–8.0)

## 2017-08-02 LAB — POCT PREGNANCY, URINE: PREG TEST UR: NEGATIVE

## 2017-08-02 MED ORDER — IBUPROFEN 800 MG PO TABS: 800.0000 mg | ORAL_TABLET | Freq: Three times a day (TID) | ORAL | 3 refills | Status: DC

## 2017-08-02 MED ORDER — IBUPROFEN 800 MG PO TABS
800.0000 mg | ORAL_TABLET | Freq: Once | ORAL | Status: AC
  Administered 2017-08-02: 800 mg via ORAL
  Filled 2017-08-02: qty 1

## 2017-08-02 NOTE — MAU Provider Note (Signed)
History   21 yo female in with c/o right side pain. States has not taken anything for the pain. In reviewing chart pt no showed for appt Thursday in GYN clinic for f/u from ER visit on 07/23/17. States has not has sex since being screened for STD's that day.  CSN: [id number]  Arrival date & time 08/02/17  1545   First Provider Initiated Contact with Patient 08/02/17 1608      No chief complaint on file.   HPI  Past Medical History:  Diagnosis Date  . Bacterial vaginosis     History reviewed. No pertinent surgical history.  Family History  Problem Relation Age of Onset  . Diabetes Maternal Grandfather     Social History  Substance Use Topics  . Smoking status: Never Smoker  . Smokeless tobacco: Never Used  . Alcohol use No    OB History    No data available      Review of Systems  Constitutional: Negative.   HENT: Negative.   Eyes: Negative.   Respiratory: Negative.   Cardiovascular: Negative.   Gastrointestinal: Positive for abdominal pain.  Endocrine: Negative.   Genitourinary: Negative.   Musculoskeletal: Negative.   Skin: Negative.   Allergic/Immunologic: Negative.   Neurological: Negative.   Hematological: Negative.   Psychiatric/Behavioral: Negative.     Allergies  Patient has no known allergies.  Home Medications    BP (!) 119/56 (BP Location: Right Arm)   Pulse 82   Temp 98.2 F (36.8 C) (Oral)   Resp 16   Wt 111 lb 1.9 oz (50.4 kg)   LMP 07/14/2017   SpO2 100%   BMI 21.00 kg/m   Physical Exam  Constitutional: She is oriented to person, place, and time. She appears well-developed and well-nourished.  HENT:  Head: Normocephalic.  Eyes: Pupils are equal, round, and reactive to light.  Neck: Normal range of motion.  Cardiovascular: Normal rate, regular rhythm, normal heart sounds and intact distal pulses.   Pulmonary/Chest: Effort normal and breath sounds normal.  Abdominal: Soft. Bowel sounds are normal.  Genitourinary: Vagina normal  and uterus normal.  Musculoskeletal: Normal range of motion.  Neurological: She is alert and oriented to person, place, and time. She has normal reflexes.  Skin: Skin is warm and dry.  Psychiatric: She has a normal mood and affect. Her behavior is normal. Judgment and thought content normal.    MAU Course  Procedures (including critical care time)  Labs Reviewed  URINALYSIS, ROUTINE W REFLEX MICROSCOPIC  POCT PREGNANCY, URINE   No results found.   No diagnosis found.    MDM  Exam normal, labs and x rays reviewed. Pt in no acute distress. Stressed importance of keeping her appointments for follow up. Will give motrin 800 for pain. Then d/c home to follow up in GYN clinic.

## 2017-08-02 NOTE — MAU Note (Signed)
States was recently seen at [facility] ; currently taking medication for gonorrhea and BV for past 7 days. Also states was told she had a cyst. Has appointment on Oct 2 with the clinic but was in too much pain to wait.  Still having right sided abdominal pain.  Rating pain 7/10 Discomfort, dull, and pressure at times.  Denies vaginal bleeding or discharge. Burning with urination  LMP 07/14/17

## 2017-08-02 NOTE — Discharge Instructions (Signed)

## 2017-08-08 ENCOUNTER — Encounter (HOSPITAL_COMMUNITY): Payer: Self-pay

## 2017-08-08 ENCOUNTER — Inpatient Hospital Stay (HOSPITAL_COMMUNITY)
Admission: AD | Admit: 2017-08-08 | Discharge: 2017-08-08 | Disposition: A | Discharge status: Home / Self Care | Payer: Medicaid Other | Source: Ambulatory Visit | Attending: Obstetrics & Gynecology | Admitting: Obstetrics & Gynecology

## 2017-08-08 DIAGNOSIS — R1031 Right lower quadrant pain: Secondary | ICD-10-CM | POA: Diagnosis present

## 2017-08-08 DIAGNOSIS — B373 Candidiasis of vulva and vagina: Secondary | ICD-10-CM | POA: Insufficient documentation

## 2017-08-08 DIAGNOSIS — N731 Chronic parametritis and pelvic cellulitis: Secondary | ICD-10-CM | POA: Diagnosis not present

## 2017-08-08 DIAGNOSIS — B3731 Acute candidiasis of vulva and vagina: Secondary | ICD-10-CM

## 2017-08-08 DIAGNOSIS — N83201 Unspecified ovarian cyst, right side: Secondary | ICD-10-CM | POA: Insufficient documentation

## 2017-08-08 DIAGNOSIS — Z3202 Encounter for pregnancy test, result negative: Secondary | ICD-10-CM | POA: Diagnosis not present

## 2017-08-08 LAB — COMPREHENSIVE METABOLIC PANEL
ALT: 12 U/L — ABNORMAL LOW (ref 14–54)
ANION GAP: 8 (ref 5–15)
AST: 17 U/L (ref 15–41)
Albumin: 4.3 g/dL (ref 3.5–5.0)
Alkaline Phosphatase: 60 U/L (ref 38–126)
BUN: 11 mg/dL (ref 6–20)
CHLORIDE: 107 mmol/L (ref 101–111)
CO2: 25 mmol/L (ref 22–32)
Calcium: 9.1 mg/dL (ref 8.9–10.3)
Creatinine, Ser: 0.72 mg/dL (ref 0.44–1.00)
GFR calc non Af Amer: >60 mL/min (ref >60)
Glucose, Bld: 84 mg/dL (ref 65–99)
Potassium: 3.8 mmol/L (ref 3.5–5.1)
SODIUM: 140 mmol/L (ref 135–145)
Total Bilirubin: 1.1 mg/dL (ref 0.3–1.2)
Total Protein: 7.5 g/dL (ref 6.5–8.1)

## 2017-08-08 LAB — URINALYSIS, ROUTINE W REFLEX MICROSCOPIC
Bilirubin Urine: NEGATIVE
Glucose, UA: NEGATIVE mg/dL
Hgb urine dipstick: NEGATIVE
Ketones, ur: NEGATIVE mg/dL
Leukocytes, UA: NEGATIVE
NITRITE: NEGATIVE
Protein, ur: NEGATIVE mg/dL
SPECIFIC GRAVITY, URINE: 1.024 (ref 1.005–1.030)
pH: 5 (ref 5.0–8.0)

## 2017-08-08 LAB — WET PREP, GENITAL
Clue Cells Wet Prep HPF POC: NONE SEEN
SPERM: NONE SEEN
TRICH WET PREP: NONE SEEN
YEAST WET PREP: NONE SEEN

## 2017-08-08 LAB — CBC
HCT: 38.6 % (ref 36.0–46.0)
Hemoglobin: 13 g/dL (ref 12.0–15.0)
MCH: 29.7 pg (ref 26.0–34.0)
MCHC: 33.7 g/dL (ref 30.0–36.0)
MCV: 88.3 fL (ref 78.0–100.0)
PLATELETS: 297 10*3/uL (ref 150–400)
RBC: 4.37 MIL/uL (ref 3.87–5.11)
RDW: 14.4 % (ref 11.5–15.5)
WBC: 7.5 10*3/uL (ref 4.0–10.5)

## 2017-08-08 LAB — POCT PREGNANCY, URINE: PREG TEST UR: NEGATIVE

## 2017-08-08 LAB — LIPASE, BLOOD: Lipase: 25 U/L (ref 11–51)

## 2017-08-08 MED ORDER — NYSTATIN-TRIAMCINOLONE 100000-0.1 UNIT/GM-% EX OINT: 1.0000 "application " | TOPICAL_OINTMENT | Freq: Two times a day (BID) | CUTANEOUS | 0 refills | Status: DC

## 2017-08-08 MED ORDER — DICLOFENAC SODIUM 75 MG PO TBEC: 75.0000 mg | DELAYED_RELEASE_TABLET | Freq: Two times a day (BID) | ORAL | 2 refills | Status: AC

## 2017-08-08 MED ORDER — FLUCONAZOLE 150 MG PO TABS: 150.0000 mg | ORAL_TABLET | Freq: Once | ORAL | 0 refills | Status: AC

## 2017-08-08 NOTE — MAU Note (Signed)
+  lower abdominal pain still--rating pain--sharp Still having itching and burning  Completed medication for BV and has tried ibuprofen with no relief

## 2017-08-08 NOTE — MAU Provider Note (Signed)
Chief Complaint: Abdominal Pain and Vaginal Itching   None     SUBJECTIVE HPI: Tammy Sanchez is a 21 y.o. G0 who presents to maternity admissions reporting ongoing right lower quadrant pain, vaginal discharge with itching, and onset of light vaginal bleeding today.  She has had multiple ED and MAU visits for similar symptoms starting on 07/23/17 and had [place] on 9/5 indicating possible TOA, and was treated for PID. She just completed her course of Flagyl and Doxycycline.  She reports dysuria and vaginal itching are worse now than when she first presented and her abdominal cramping, which is intermittent right sided pain is unchanged in intensity but still present.  Patient's last menstrual period was 07/18/2017 (within days).  She has not tried any treatments, nothing makes it better or worse.  There are no other associated symptoms. She missed appointment in [facility] office but has [place] and office visit rescheduled for 08/19/17. She denies h/a, dizziness, n/v, or fever/chills.     HPI  Past Medical History:  Diagnosis Date  . Bacterial vaginosis    History reviewed. No pertinent surgical history. Social History   Social History  . Marital status: Single    Spouse name: N/A  . Number of children: N/A  . Years of education: N/A   Occupational History  . Not on file.   Social History Main Topics  . Smoking status: Never Smoker  . Smokeless tobacco: Never Used  . Alcohol use No  . Drug use: No  . Sexual activity: Yes    Birth control/ protection: None   Other Topics Concern  . Not on file   Social History Narrative  . No narrative on file   No current facility-administered medications on file prior to encounter.    Current Outpatient Prescriptions on File Prior to Encounter  Medication Sig Dispense Refill  . ibuprofen (ADVIL,MOTRIN) 800 MG tablet Take 1 tablet (800 mg total) by mouth 3 (three) times daily. 21 tablet 3  . doxycycline (VIBRAMYCIN) 100 MG capsule Take 1 capsule (100 mg  total) by mouth 2 (two) times daily. (Patient not taking: Reported on 08/08/2017) 28 capsule 0  . metroNIDAZOLE (FLAGYL) 500 MG tablet Take 1 tablet (500 mg total) by mouth 2 (two) times daily. (Patient not taking: Reported on 08/08/2017) 14 tablet 0   No Known Allergies  ROS:  Review of Systems  Constitutional: Negative for chills, fatigue and fever.  Respiratory: Negative for shortness of breath.   Cardiovascular: Negative for chest pain.  Gastrointestinal: Positive for abdominal pain. Negative for constipation, diarrhea, nausea and vomiting.  Genitourinary: Positive for pelvic pain and vaginal bleeding. Negative for difficulty urinating, dysuria, flank pain, vaginal discharge and vaginal pain.  Neurological: Negative for dizziness and headaches.  Psychiatric/Behavioral: Negative.      I have reviewed patient's Past Medical Hx, Surgical Hx, Family Hx, Social Hx, medications and allergies.   Physical Exam  Patient Vitals for the past 24 hrs:  BP Temp Temp src Pulse Resp SpO2 Weight  08/08/17 1611 115/60 98 F (36.7 C) Oral 73 16 99 % 109 lb 1.3 oz (49.5 kg)   Constitutional: Well-developed, well-nourished female in no acute distress.  Cardiovascular: normal rate Respiratory: normal effort GI: Abd soft, non-tender. Pos BS x 4 MS: Extremities nontender, no edema, normal ROM Neurologic: Alert and oriented x 4.  GU: Neg CVAT.  PELVIC EXAM: Cervix pink, visually closed, without lesion, small amount light red bleeding, vaginal walls and external genitalia normal Bimanual exam: Cervix 0/long/high,  firm, anterior, neg CMT, uterus nontender, nonenlarged, adnexa with mild tenderness and fullness but no mass palpable on right, left wnl without tenderness   LAB RESULTS Results for orders placed or performed during the hospital encounter of 08/08/17 (from the past 24 hour(s))  Urinalysis, Routine w reflex microscopic     Status: Abnormal   Collection Time: 08/08/17  4:13 PM  Result Value  Ref Range   Color, Urine YELLOW YELLOW   APPearance HAZY (A) CLEAR   Specific Gravity, Urine 1.024 1.005 - 1.030   pH 5.0 5.0 - 8.0   Glucose, UA NEGATIVE NEGATIVE mg/dL   Hgb urine dipstick NEGATIVE NEGATIVE   Bilirubin Urine NEGATIVE NEGATIVE   Ketones, ur NEGATIVE NEGATIVE mg/dL   Protein, ur NEGATIVE NEGATIVE mg/dL   Nitrite NEGATIVE NEGATIVE   Leukocytes, UA NEGATIVE NEGATIVE  Pregnancy, urine POC     Status: None   Collection Time: 08/08/17  4:38 PM  Result Value Ref Range   Preg Test, Ur NEGATIVE NEGATIVE  Wet prep, genital     Status: Abnormal   Collection Time: 08/08/17  5:55 PM  Result Value Ref Range   Yeast Wet Prep HPF POC NONE SEEN NONE SEEN   Trich, Wet Prep NONE SEEN NONE SEEN   Clue Cells Wet Prep HPF POC NONE SEEN NONE SEEN   WBC, Wet Prep HPF POC FEW (A) NONE SEEN   Sperm NONE SEEN        IMAGING US Pelvis Transvanginal Non-ob (tv Only)  Result Date: 07/23/2017 CLINICAL DATA:  General pelvic pain. EXAM: TRANSABDOMINAL AND TRANSVAGINAL ULTRASOUND OF PELVIS DOPPLER ULTRASOUND OF OVARIES TECHNIQUE: Both transabdominal and transvaginal ultrasound examinations of the pelvis were performed. Transabdominal technique was performed for global imaging of the pelvis including uterus, ovaries, adnexal regions, and pelvic cul-de-sac. It was necessary to proceed with endovaginal exam following the transabdominal exam to visualize the endometrium. Color and duplex Doppler ultrasound was utilized to evaluate blood flow to the ovaries. COMPARISON:  None. FINDINGS: Uterus Measurements: 6.8 x 4.3 x 4.7 cm. Uterus is retroverted. No fibroids or other mass visualized. Endometrium Thickness: 14 mm. Heterogeneous without focal abnormality visualized. Right ovary Measurements: 3.6 x 2.1 x 4.2 cm. Normal appearance/no adnexal mass. Left ovary Measurements: 4.6 x 2.9 x 3.4 cm. Normal appearance. There is a tubular, fluid-filled structure in the left adnexa containing thickened, somewhat  nodular longitudinal folds and incomplete septae. Pulsed Doppler evaluation of both ovaries demonstrates normal low-resistance arterial and venous waveforms. Other findings No abnormal free fluid. IMPRESSION: 1. Tubular, fluid-filled structure in the left adnexa contain thickened, somewhat nodular folds, suspicious for hydrosalpinx. Consider further evaluation with pelvic MRI as clinically indicated. 2. No sonographic evidence of ovarian torsion. Electronically Signed   By: Obie Dredge M.D.   On: 07/23/2017 12:27   US Pelvis (transabdominal Only)  Result Date: 07/23/2017 CLINICAL DATA:  General pelvic pain. EXAM: TRANSABDOMINAL AND TRANSVAGINAL ULTRASOUND OF PELVIS DOPPLER ULTRASOUND OF OVARIES TECHNIQUE: Both transabdominal and transvaginal ultrasound examinations of the pelvis were performed. Transabdominal technique was performed for global imaging of the pelvis including uterus, ovaries, adnexal regions, and pelvic cul-de-sac. It was necessary to proceed with endovaginal exam following the transabdominal exam to visualize the endometrium. Color and duplex Doppler ultrasound was utilized to evaluate blood flow to the ovaries. COMPARISON:  None. FINDINGS: Uterus Measurements: 6.8 x 4.3 x 4.7 cm. Uterus is retroverted. No fibroids or other mass visualized. Endometrium Thickness: 14 mm. Heterogeneous without focal abnormality visualized. Right ovary Measurements: 3.6  x 2.1 x 4.2 cm. Normal appearance/no adnexal mass. Left ovary Measurements: 4.6 x 2.9 x 3.4 cm. Normal appearance. There is a tubular, fluid-filled structure in the left adnexa containing thickened, somewhat nodular longitudinal folds and incomplete septae. Pulsed Doppler evaluation of both ovaries demonstrates normal low-resistance arterial and venous waveforms. Other findings No abnormal free fluid. IMPRESSION: 1. Tubular, fluid-filled structure in the left adnexa contain thickened, somewhat nodular folds, suspicious for hydrosalpinx. Consider  further evaluation with pelvic MRI as clinically indicated. 2. No sonographic evidence of ovarian torsion. Electronically Signed   By: Obie Dredge M.D.   On: 07/23/2017 12:27   US Pelvic Doppler (torsion R/o Or Mass Arterial Flow)  Result Date: 07/23/2017 CLINICAL DATA:  General pelvic pain. EXAM: TRANSABDOMINAL AND TRANSVAGINAL ULTRASOUND OF PELVIS DOPPLER ULTRASOUND OF OVARIES TECHNIQUE: Both transabdominal and transvaginal ultrasound examinations of the pelvis were performed. Transabdominal technique was performed for global imaging of the pelvis including uterus, ovaries, adnexal regions, and pelvic cul-de-sac. It was necessary to proceed with endovaginal exam following the transabdominal exam to visualize the endometrium. Color and duplex Doppler ultrasound was utilized to evaluate blood flow to the ovaries. COMPARISON:  None. FINDINGS: Uterus Measurements: 6.8 x 4.3 x 4.7 cm. Uterus is retroverted. No fibroids or other mass visualized. Endometrium Thickness: 14 mm. Heterogeneous without focal abnormality visualized. Right ovary Measurements: 3.6 x 2.1 x 4.2 cm. Normal appearance/no adnexal mass. Left ovary Measurements: 4.6 x 2.9 x 3.4 cm. Normal appearance. There is a tubular, fluid-filled structure in the left adnexa containing thickened, somewhat nodular longitudinal folds and incomplete septae. Pulsed Doppler evaluation of both ovaries demonstrates normal low-resistance arterial and venous waveforms. Other findings No abnormal free fluid. IMPRESSION: 1. Tubular, fluid-filled structure in the left adnexa contain thickened, somewhat nodular folds, suspicious for hydrosalpinx. Consider further evaluation with pelvic MRI as clinically indicated. 2. No sonographic evidence of ovarian torsion. Electronically Signed   By: Obie Dredge M.D.   On: 07/23/2017 12:27    MAU Management/MDM: Ordered labs and reviewed results. No acute abdomen, no evidence of systemic infection.  Consult Dr Emelda Fear with  assessment and findings.  Will treat with Diclofenac BID, Diflucan 150 mg x 1 dose, and topical Mycolog.  Sent message to [facility] for appointment next week, outpatient [place] reordered.  Return to MAU as needed for emergencies.  Pt stable at time of discharge.  ASSESSMENT 1. Chronic PID (chronic pelvic inflammatory disease)   2. Ovarian cyst, right   3. Cyst of right ovary   4. Vaginal yeast infection     PLAN Discharge home Allergies as of 08/08/2017   No Known Allergies     Medication List    STOP taking these medications   doxycycline 100 MG capsule Commonly known as:  VIBRAMYCIN   ibuprofen 800 MG tablet Commonly known as:  ADVIL,MOTRIN   metroNIDAZOLE 500 MG tablet Commonly known as:  FLAGYL     TAKE these medications   diclofenac 75 MG EC tablet Commonly known as:  VOLTAREN Take 1 tablet (75 mg total) by mouth 2 (two) times daily with a meal.   fluconazole 150 MG tablet Commonly known as:  DIFLUCAN Take 1 tablet (150 mg total) by mouth once.   nystatin-triamcinolone ointment Commonly known as:  MYCOLOG Apply 1 application topically 2 (two) times daily.            Discharge Care Instructions        Start     Ordered   08/08/17 0000  diclofenac (VOLTAREN) 75 MG EC tablet  2 times daily with meals    Question:  Supervising Provider  Answer:  Tilda Burrow   08/08/17 1917   08/08/17 0000  US PELVIS TRANSVANGINAL NON-OB (TV ONLY)    Question Answer Comment  Reason for exam: ovarian cyst ,right   Preferred imaging location? [facility]      08/08/17 1917   08/08/17 0000  Discharge patient    Question Answer Comment  Discharge disposition 01-Home or Self Care   Discharge patient date 08/08/2017      08/08/17 1917   08/08/17 0000  fluconazole (DIFLUCAN) 150 MG tablet   Once    Question:  Supervising Provider  Answer:  Tilda Burrow   08/08/17 1923   08/08/17 0000  nystatin-triamcinolone ointment (MYCOLOG)  2 times daily    Question:  Supervising  Provider  Answer:  Tilda Burrow   08/08/17 1923     Follow-up Information    [facility] Follow up.   Specialty:  Obstetrics and Gynecology Why:  The office will call you with follow up appointment next week. Return to MAU as needed for emergencies. Contact information: [address] [place] [address] [phone]          Sharen Counter Certified Nurse-Midwife 08/08/2017  7:30 PM

## 2017-08-08 NOTE — Discharge Instructions (Signed)
In late 2019, the [facility] will be moving to the [place] campus. At that time, the MAU will no longer serve non-pregnant patients. We encourage you to establish care with a provider before that time, so that you can be seen with any GYN concerns, like vaginal discharge, urinary tract infection, etc.. in a timely manner. In order to make the office visit more convenient, the [facility] will be offering evening hours from 4pm-8pm on Mondays starting 07/28/17. There will be same-day appointments, walk-in appointments and scheduled appointments available during this time.   ° °[facility] - [phone] ° °For urgent needs,  Urgent Care is also available for management of urgent GYN complaints such as vaginal discharge.  ° °[employer] extends eligibility for family planning services to reduce unintended pregnancies and improve the well-being of children and families. ° ° Eligible individuals whose income is at or below 195% of the federal poverty level and who are:  °- [place] citizens, documented immigrants or qualified aliens;  °- Residents of [place];  °- Not incarcerated; and  °- Not pregnant.  ° °[employer] Contact Information:  °Medical Assistance Clinical Section °Phone: [phone] °Email: dma.[email or web address]  ° ° ° °

## 2017-08-09 LAB — RPR: RPR: NONREACTIVE

## 2017-08-09 LAB — HIV ANTIBODY (ROUTINE TESTING W REFLEX): HIV SCREEN 4TH GENERATION: NONREACTIVE

## 2017-08-11 LAB — GC/CHLAMYDIA PROBE AMP (~~LOC~~) NOT AT ARMC
CHLAMYDIA, DNA PROBE: NEGATIVE
Neisseria Gonorrhea: NEGATIVE

## 2017-08-14 ENCOUNTER — Telehealth: Payer: Self-pay | Admitting: *Deleted

## 2017-08-14 ENCOUNTER — Ambulatory Visit (HOSPITAL_COMMUNITY): Payer: Medicaid Other | Attending: Advanced Practice Midwife

## 2017-08-14 NOTE — Telephone Encounter (Signed)
Received call about precert for [place] ordered for today, completed.

## 2017-08-18 ENCOUNTER — Telehealth: Payer: Self-pay | Admitting: *Deleted

## 2017-08-18 MED ORDER — NYSTATIN 100000 UNIT/GM EX OINT: 1.0000 "application " | TOPICAL_OINTMENT | Freq: Two times a day (BID) | CUTANEOUS | 0 refills | Status: AC

## 2017-08-18 MED ORDER — TRIAMCINOLONE ACETONIDE 0.5 % EX OINT: 1.0000 "application " | TOPICAL_OINTMENT | Freq: Two times a day (BID) | CUTANEOUS | 0 refills | Status: AC

## 2017-08-18 NOTE — Telephone Encounter (Signed)
Called pt and left message stating that we have received fax from her pharmacy indicating that her insurance did not cover the Rx prescribed on 9/21. (Nystatin-triamcinolone ointment) This is because it is a combination of 2 medications. New prescriptions for the individual medication ointments will be sent to her pharmacy.  She may use them together as prescribed twice daily. She may call back if she has questions.

## 2017-08-19 ENCOUNTER — Encounter: Payer: Self-pay | Admitting: Obstetrics and Gynecology

## 2017-08-19 ENCOUNTER — Ambulatory Visit: Payer: Medicaid Other | Admitting: Obstetrics and Gynecology

## 2017-08-19 NOTE — Progress Notes (Signed)
Patient did not keep GYN appointment for 08/19/2017.  Cornelia Copa MD Attending Center for [employer])

## 2017-09-12 ENCOUNTER — Ambulatory Visit (INDEPENDENT_AMBULATORY_CARE_PROVIDER_SITE_OTHER): Payer: Medicaid Other | Admitting: Family Medicine

## 2017-09-12 ENCOUNTER — Encounter: Payer: Self-pay | Admitting: Family Medicine

## 2017-09-12 DIAGNOSIS — N730 Acute parametritis and pelvic cellulitis: Secondary | ICD-10-CM | POA: Diagnosis present

## 2017-09-12 NOTE — Progress Notes (Signed)
   Subjective:    Patient ID: Tammy Sanchez is a 21 y.o. female presenting with No chief complaint on file.  on 09/12/2017  HPI: Tested for chlamydia and found to have a dilated tube and got extended treatment and symptoms have resolved.  Review of Systems  Constitutional: Negative for chills and fever.  Respiratory: Negative for shortness of breath.   Cardiovascular: Negative for chest pain.  Gastrointestinal: Negative for abdominal pain, nausea and vomiting.  Genitourinary: Negative for dysuria.  Skin: Negative for rash.      Objective:    BP 108/65   Pulse 63   Wt 114 lb 8 oz (51.9 kg)   LMP 09/07/2017 (Exact Date)   BMI 21.63 kg/m  Physical Exam  Constitutional: She is oriented to person, place, and time. She appears well-developed and well-nourished. No distress.  HENT:  Head: Normocephalic and atraumatic.  Eyes: No scleral icterus.  Neck: Neck supple.  Cardiovascular: Normal rate.   Pulmonary/Chest: Effort normal.  Abdominal: Soft.  Neurological: She is alert and oriented to person, place, and time.  Skin: Skin is warm and dry.  Psychiatric: She has a normal mood and affect.      Assessment & Plan:   Problem List Items Addressed This Visit      Unprioritized   PID (acute pelvic inflammatory disease)    Symptoms have improved--she would like f/u pelvic sono to see status of tubes. Risks with hydrosalpinx discussed at length. S/p Abx.        She is uninterested in contracepting at the time. Total face-to-face time with patient: 20 minutes. Over 50% of encounter was spent on counseling and coordination of care. Return in about 2 months (around 11/12/2017).  Reva Boresanya S Dvonte Gatliff 09/12/2017 11:27 AM

## 2017-09-12 NOTE — Patient Instructions (Signed)
Pelvic Inflammatory Disease °Pelvic inflammatory disease (PID) is an infection in some or all of the female organs. PID can be in the uterus, ovaries, fallopian tubes, or the surrounding tissues that are inside the lower belly area (pelvis). PID can lead to lasting problems if it is not treated. To check for this disease, your doctor may: °· Do a physical exam. °· Do blood tests, urine tests, or a pregnancy test. °· Look at your vaginal discharge. °· Do tests to look inside the pelvis. °· Test you for other infections. ° °Follow these instructions at home: °· Take over-the-counter and prescription medicines only as told by your doctor. °· If you were prescribed an antibiotic medicine, take it as told by your doctor. Do not stop taking it even if you start to feel better. °· Do not have sex until treatment is done or as told by your doctor. °· Tell your sex partner if you have PID. Your partner may need to be treated. °· Keep all follow-up visits as told by your doctor. This is important. °· Your doctor may test you for infection again 3 months after you are treated. °Contact a doctor if: °· You have more fluid (discharge) coming from your vagina or fluid that is not normal. °· Your pain does not improve. °· You throw up (vomit). °· You have a fever. °· You cannot take your medicines. °· Your partner has a sexually transmitted disease (STD). °· You have pain when you pee (urinate). °Get help right away if: °· You have more belly (abdominal) or lower belly pain. °· You have chills. °· You are not better after 72 hours. °This information is not intended to replace advice given to you by your health care provider. Make sure you discuss any questions you have with your health care provider. °Document Released: 01/31/2009 Document Revised: 04/11/2016 Document Reviewed: 12/12/2014 °Elsevier Interactive Patient Education © 2018 [employer] ° °

## 2017-09-12 NOTE — Assessment & Plan Note (Signed)
Symptoms have improved--she would like f/u pelvic sono to see status of tubes. Risks with hydrosalpinx discussed at length. S/p Abx.

## 2017-09-17 ENCOUNTER — Telehealth (HOSPITAL_COMMUNITY): Payer: Self-pay

## 2017-09-18 ENCOUNTER — Ambulatory Visit (HOSPITAL_COMMUNITY): Payer: Medicaid Other

## 2017-09-18 ENCOUNTER — Ambulatory Visit (HOSPITAL_COMMUNITY): Admission: RE | Admit: 2017-09-18 | Payer: Medicaid Other | Source: Ambulatory Visit

## 2017-11-10 ENCOUNTER — Encounter (HOSPITAL_COMMUNITY): Payer: Self-pay | Admitting: Nurse Practitioner

## 2017-11-10 ENCOUNTER — Other Ambulatory Visit: Payer: Self-pay

## 2017-11-10 DIAGNOSIS — N644 Mastodynia: Secondary | ICD-10-CM | POA: Diagnosis present

## 2017-11-10 DIAGNOSIS — Z5321 Procedure and treatment not carried out due to patient leaving prior to being seen by health care provider: Secondary | ICD-10-CM | POA: Diagnosis not present

## 2017-11-10 LAB — BASIC METABOLIC PANEL
ANION GAP: 8 (ref 5–15)
BUN: 11 mg/dL (ref 6–20)
CO2: 22 mmol/L (ref 22–32)
Calcium: 9.1 mg/dL (ref 8.9–10.3)
Chloride: 106 mmol/L (ref 101–111)
Creatinine, Ser: 0.74 mg/dL (ref 0.44–1.00)
GFR calc Af Amer: >60 mL/min (ref >60)
GLUCOSE: 102 mg/dL — AB (ref 65–99)
POTASSIUM: 3.9 mmol/L (ref 3.5–5.1)
Sodium: 136 mmol/L (ref 135–145)

## 2017-11-10 LAB — I-STAT BETA HCG BLOOD, ED (MC, WL, AP ONLY): I-stat hCG, quantitative: <5 m[IU]/mL (ref <5)

## 2017-11-10 LAB — CBC
HEMATOCRIT: 37.4 % (ref 36.0–46.0)
HEMOGLOBIN: 12.9 g/dL (ref 12.0–15.0)
MCH: 30.3 pg (ref 26.0–34.0)
MCHC: 34.5 g/dL (ref 30.0–36.0)
MCV: 87.8 fL (ref 78.0–100.0)
Platelets: 266 10*3/uL (ref 150–400)
RBC: 4.26 MIL/uL (ref 3.87–5.11)
RDW: 13.7 % (ref 11.5–15.5)
WBC: 6.8 10*3/uL (ref 4.0–10.5)

## 2017-11-10 NOTE — ED Triage Notes (Addendum)
Pt notes right breast redness, tenderness and swelling started a few days ago and has increased.Patient states pain is throughout right breast where it is red and tender and goes through chest to left breast as well. Pt notes clear discharge from bilateral nipples but states she has had that since she got her nipples pierced 2 years ago.

## 2017-11-11 ENCOUNTER — Emergency Department (HOSPITAL_COMMUNITY)
Admission: EM | Admit: 2017-11-11 | Discharge: 2017-11-11 | Disposition: A | Discharge status: Home / Self Care | Payer: Medicaid Other | Attending: Emergency Medicine | Admitting: Emergency Medicine

## 2017-11-11 NOTE — ED Notes (Addendum)
Pt called for multiple times, Pt has been moved OTF with approval from charge.

## 2017-11-11 NOTE — ED Notes (Signed)
Pt roomed to D36, Pt called X3 times. Pt moved back to waiting area.

## 2017-11-29 ENCOUNTER — Other Ambulatory Visit: Payer: Self-pay

## 2017-11-29 ENCOUNTER — Encounter (HOSPITAL_COMMUNITY): Payer: Self-pay | Admitting: Emergency Medicine

## 2017-11-29 ENCOUNTER — Emergency Department (HOSPITAL_COMMUNITY): Payer: Medicaid Other

## 2017-11-29 ENCOUNTER — Emergency Department (HOSPITAL_COMMUNITY)
Admission: EM | Admit: 2017-11-29 | Discharge: 2017-11-29 | Disposition: A | Discharge status: Home / Self Care | Payer: Medicaid Other | Attending: Emergency Medicine | Admitting: Emergency Medicine

## 2017-11-29 DIAGNOSIS — Z79899 Other long term (current) drug therapy: Secondary | ICD-10-CM | POA: Diagnosis not present

## 2017-11-29 DIAGNOSIS — N61 Mastitis without abscess: Secondary | ICD-10-CM | POA: Diagnosis not present

## 2017-11-29 DIAGNOSIS — R071 Chest pain on breathing: Secondary | ICD-10-CM | POA: Insufficient documentation

## 2017-11-29 DIAGNOSIS — N898 Other specified noninflammatory disorders of vagina: Secondary | ICD-10-CM | POA: Diagnosis not present

## 2017-11-29 DIAGNOSIS — R079 Chest pain, unspecified: Secondary | ICD-10-CM | POA: Diagnosis present

## 2017-11-29 DIAGNOSIS — R0781 Pleurodynia: Secondary | ICD-10-CM

## 2017-11-29 LAB — BASIC METABOLIC PANEL
Anion gap: 8 (ref 5–15)
BUN: 13 mg/dL (ref 6–20)
CALCIUM: 9.3 mg/dL (ref 8.9–10.3)
CO2: 25 mmol/L (ref 22–32)
CREATININE: 0.66 mg/dL (ref 0.44–1.00)
Chloride: 105 mmol/L (ref 101–111)
GFR calc Af Amer: >60 mL/min (ref >60)
GFR calc non Af Amer: >60 mL/min (ref >60)
GLUCOSE: 81 mg/dL (ref 65–99)
POTASSIUM: 3.6 mmol/L (ref 3.5–5.1)
Sodium: 138 mmol/L (ref 135–145)

## 2017-11-29 LAB — CBC
HEMATOCRIT: 38.7 % (ref 36.0–46.0)
Hemoglobin: 12.8 g/dL (ref 12.0–15.0)
MCH: 29.7 pg (ref 26.0–34.0)
MCHC: 33.1 g/dL (ref 30.0–36.0)
MCV: 89.8 fL (ref 78.0–100.0)
Platelets: 301 10*3/uL (ref 150–400)
RBC: 4.31 MIL/uL (ref 3.87–5.11)
RDW: 13.8 % (ref 11.5–15.5)
WBC: 6.1 10*3/uL (ref 4.0–10.5)

## 2017-11-29 LAB — I-STAT TROPONIN, ED
Troponin i, poc: 0 ng/mL (ref 0.00–0.08)
Troponin i, poc: 0 ng/mL (ref 0.00–0.08)

## 2017-11-29 LAB — I-STAT BETA HCG BLOOD, ED (MC, WL, AP ONLY)

## 2017-11-29 MED ORDER — METRONIDAZOLE 500 MG PO TABS
2000.0000 mg | ORAL_TABLET | Freq: Once | ORAL | Status: AC
  Administered 2017-11-29: 2000 mg via ORAL
  Filled 2017-11-29: qty 4

## 2017-11-29 MED ORDER — AZITHROMYCIN 250 MG PO TABS
1000.0000 mg | ORAL_TABLET | Freq: Once | ORAL | Status: AC
  Administered 2017-11-29: 1000 mg via ORAL
  Filled 2017-11-29: qty 4

## 2017-11-29 MED ORDER — STERILE WATER FOR INJECTION IJ SOLN
INTRAMUSCULAR | Status: AC
  Administered 2017-11-29: 1 mL
  Filled 2017-11-29: qty 10

## 2017-11-29 MED ORDER — DOXYCYCLINE HYCLATE 100 MG PO CAPS: 100.0000 mg | ORAL_CAPSULE | Freq: Two times a day (BID) | ORAL | 0 refills | Status: AC

## 2017-11-29 MED ORDER — CEFTRIAXONE SODIUM 250 MG IJ SOLR
250.0000 mg | Freq: Once | INTRAMUSCULAR | Status: AC
  Administered 2017-11-29: 250 mg via INTRAMUSCULAR
  Filled 2017-11-29: qty 250

## 2017-11-29 NOTE — ED Notes (Signed)
Pt discharged from ED; instructions provided and scripts given; Pt encouraged to return to ED if symptoms worsen and to f/u with PCP; Pt verbalized understanding of all instructions 

## 2017-11-29 NOTE — Discharge Instructions (Signed)
Please take the antibiotics prescribed, and will cover the breast infection and the pelvic infection. You need to see her primary care doctor in 1 week for reassessment.  Return to the ER immediately if you start having worsening of the breast pain, swelling, fevers, chills. Also return to the ER if you have worsening chest pain with associated shortness of breath, dizziness, fainting.

## 2017-11-29 NOTE — ED Triage Notes (Signed)
Pt presents to ED for assessment of left sided chest pain with radiation to her left scapula, x 4 weeks.  Pt came previously to be seen for same, but LWBS.  Pt denies associated symptoms.  Pt c/o nipple swelling, used to have piercings, removed them and states nipples have resolved some.  Pt was taking antibiotics for PID and did not finish her antibiotics.

## 2017-11-29 NOTE — ED Provider Notes (Signed)
[facility] EMERGENCY DEPARTMENT Provider Note   CSN: [id number] Arrival date & time: 11/29/17  1459     History   Chief Complaint Chief Complaint  Patient presents with  . Chest Pain    HPI Tammy Sanchez is a 22 y.o. female.  HPI  22 year old female comes in with multiple complaints.  Patient's main complaint is having chest pain.  Chest pain started 3 days ago, and is located in the superior part of her chest, and radiating to the back.  Patient is worsening of the pain with deep inspiration.  Patient denies any nausea, vomiting, fevers, chills, cough, dizziness or lightheadedness.  Patient has no history of similar symptoms in the past and she denies any history of heavy smoking, substance abuse, premature CAD in the family or any clotting disorder in the family. Pt has no hx of PE, DVT and denies any exogenous hormone (testosterone / estrogen) use, long distance travels or surgery in the past 6 weeks, active cancer, recent immobilization.  Patient also reports that she is having some breast pain around the areole on the right side.  Patient has piercing to the breast, she removed the piercing few days ago and had some crusting.  She has appreciated some discomfort in the right breast, without any drainage.  Finally, patient also reports that she is having some vaginal discharge and lower quadrant abdominal pain.  Patient was diagnosed with PID a few months ago, she did not complete her course of antibiotics.  She started having vaginal discharge and lower quadrant abdominal pain about 2 or 3 weeks ago.  Her symptoms are similar to her PID.  Patient is having unprotected intercourse with her partner, and she has advised her partner to get checked out as well.  Patient denies any dysuria, hematuria.  Past Medical History:  Diagnosis Date  . Bacterial vaginosis     Patient Active Problem List   Diagnosis Date Noted  . PID (acute pelvic inflammatory disease)  09/12/2017    History reviewed. No pertinent surgical history.  OB History    Gravida Para Term Preterm AB Living   0 0 0 0 0 0   SAB TAB Ectopic Multiple Live Births   0 0 0 0 0       Home Medications    Prior to Admission medications   Medication Sig Start Date End Date Taking? Authorizing Provider  ibuprofen (ADVIL,MOTRIN) 200 MG tablet Take 200 mg by mouth every 6 (six) hours as needed for headache (pain).   Yes [provider]  diclofenac (VOLTAREN) 75 MG EC tablet Take 1 tablet (75 mg total) by mouth 2 (two) times daily with a meal. Patient not taking: Reported on 11/29/2017 08/08/17   Leftwich-Kirby, Wilmer Floor, CNM  doxycycline (VIBRAMYCIN) 100 MG capsule Take 1 capsule (100 mg total) by mouth 2 (two) times daily. 11/29/17   Derwood Kaplan, MD  nystatin ointment (MYCOSTATIN) Apply 1 application topically 2 (two) times daily. Patient not taking: Reported on 11/29/2017 08/18/17   Sharen Counter A, CNM  triamcinolone ointment (KENALOG) 0.5 % Apply 1 application topically 2 (two) times daily. Patient not taking: Reported on 11/29/2017 08/18/17   Hurshel Party, CNM    Family History Family History  Problem Relation Age of Onset  . Diabetes Paternal Grandmother     Social History Social History   Tobacco Use  . Smoking status: Never Smoker  . Smokeless tobacco: Never Used  Substance Use Topics  . Alcohol use:  No  . Drug use: No     Allergies   Patient has no known allergies.   Review of Systems Review of Systems  All other systems reviewed and are negative.    Physical Exam Updated Vital Signs BP 112/71   Pulse 65   Temp 99.5 F (37.5 C) (Oral)   Resp 16   Ht 5\' 1"  (1.549 m)   Wt 51.7 kg (114 lb)   LMP 11/22/2017 (Within Days)   SpO2 100%   BMI 21.54 kg/m   Physical Exam  Constitutional: She is oriented to person, place, and time. She appears well-developed.  HENT:  Head: Normocephalic and atraumatic.  Eyes: EOM are normal.    Neck: Normal range of motion. Neck supple.  Cardiovascular: Normal rate.  Pulmonary/Chest: Effort normal.  Abdominal: Bowel sounds are normal.  Neurological: She is alert and oriented to person, place, and time.  Skin: Skin is warm and dry.  Nursing note and vitals reviewed.    ED Treatments / Results  Labs (all labs ordered are listed, but only abnormal results are displayed) Labs Reviewed  BASIC METABOLIC PANEL  CBC  I-STAT TROPONIN, ED  I-STAT BETA HCG BLOOD, ED ([facility], [facility], [facility] ONLY)  I-STAT TROPONIN, ED  GC/CHLAMYDIA PROBE AMP ([place]) NOT AT [facility]    EKG  EKG Interpretation  Date/Time:  Saturday November 29 2017 15:05:38 EST Ventricular Rate:  75 PR Interval:  140 QRS Duration: 68 QT Interval:  346 QTC Calculation: 386 R Axis:   33 Text Interpretation:  Sinus rhythm with marked sinus arrhythmia Otherwise normal ECG No acute changes Confirmed by Derwood Kaplananavati, Dillan Candela ([id number]) on 11/29/2017 6:17:02 PM       Radiology Dg Chest 2 View  Result Date: 11/29/2017 CLINICAL DATA:  Chest pain EXAM: CHEST  2 VIEW COMPARISON:  10/07/2016 FINDINGS: Normal heart size. Lungs hyperaerated and clear. No pneumothorax. No pleural effusion. IMPRESSION: No active cardiopulmonary disease. Electronically Signed   By: Jolaine ClickArthur  Hoss M.D.   On: 11/29/2017 15:52    Procedures Procedures (including critical care time)  Medications Ordered in ED Medications  cefTRIAXone (ROCEPHIN) injection 250 mg (not administered)  metroNIDAZOLE (FLAGYL) tablet 2,000 mg (not administered)  azithromycin (ZITHROMAX) tablet 1,000 mg (not administered)     Initial Impression / Assessment and Plan / ED Course  I have reviewed the triage vital signs and the nursing notes.  Pertinent labs & imaging results that were available during my care of the patient were reviewed by me and considered in my medical decision making (see chart for details).     Patient comes in with chief complaint of chest pain.  Patient's  main complaint is chest pain which appears to be pleuritic in nature.  Patient's well score is 0, she is PERC negative.  My suspicion for PE is low, and we have discussed strict return precautions to the ER if patient's pleuritic chest pain gets worse, or she starts having shortness of breath or dizziness.  Patient also reports areole or pain over her right breast, where she has some piercing.  At this time the right breast does appear slightly engorged but she there is no evidence of abscess on her exam.  There is also no clear evidence of erythematous lesion, but I still suspect that there is infectious process.  Have advised patient to follow-up with your own doctor in 10 days if her symptoms are not getting better despite taking the antibiotics that we will prescribed.  Finally patient also reports  that she has started having vaginal discharge and lower quadrant abdominal pain.  Patient has been diagnosed with PID in the past, and she reports that she never finished her antibiotics because her symptoms resolved.  Few weeks after stopping the antibiotic she started having lower quadrant abdominal pain followed by vaginal discharge.  Patient also admits to having unprotected intercourse over the last 2 months.  Plan is to cover patient for GC and chlamydia.  We will also give her 2 g of Flagyl.  Patient does not want a pelvic exam unless as needed, because she is in hurry and she knows she has STDs.  We will start patient on doxycycline, which will cover her both for PID and the likely breast infection.  Strict emergency room return precautions have been discussed.  Final Clinical Impressions(s) / ED Diagnoses   Final diagnoses:  Mastitis  Pleuritic chest pain  Vaginal discharge    ED Discharge Orders        Ordered    doxycycline (VIBRAMYCIN) 100 MG capsule  2 times daily     11/29/17 1941       Derwood Kaplan, MD 11/29/17 2353

## 2017-12-01 LAB — GC/CHLAMYDIA PROBE AMP (~~LOC~~) NOT AT ARMC
Chlamydia: NEGATIVE
NEISSERIA GONORRHEA: NEGATIVE

## 2018-08-25 ENCOUNTER — Encounter (HOSPITAL_COMMUNITY): Payer: Self-pay | Admitting: Emergency Medicine

## 2018-08-25 ENCOUNTER — Emergency Department (HOSPITAL_COMMUNITY)
Admission: EM | Admit: 2018-08-25 | Discharge: 2018-08-25 | Disposition: A | Discharge status: Home / Self Care | Payer: Medicaid Other | Attending: Emergency Medicine | Admitting: Emergency Medicine

## 2018-08-25 ENCOUNTER — Other Ambulatory Visit: Payer: Self-pay

## 2018-08-25 DIAGNOSIS — N898 Other specified noninflammatory disorders of vagina: Secondary | ICD-10-CM | POA: Insufficient documentation

## 2018-08-25 LAB — COMPREHENSIVE METABOLIC PANEL
ALBUMIN: 3.8 g/dL (ref 3.5–5.0)
ALT: 10 U/L (ref 0–44)
ANION GAP: 10 (ref 5–15)
AST: 18 U/L (ref 15–41)
Alkaline Phosphatase: 55 U/L (ref 38–126)
BILIRUBIN TOTAL: 1 mg/dL (ref 0.3–1.2)
BUN: 8 mg/dL (ref 6–20)
CHLORIDE: 103 mmol/L (ref 98–111)
CO2: 25 mmol/L (ref 22–32)
Calcium: 9.3 mg/dL (ref 8.9–10.3)
Creatinine, Ser: 0.73 mg/dL (ref 0.44–1.00)
GFR calc Af Amer: >60 mL/min (ref >60)
Glucose, Bld: 121 mg/dL — ABNORMAL HIGH (ref 70–99)
Potassium: 3.6 mmol/L (ref 3.5–5.1)
Sodium: 138 mmol/L (ref 135–145)
TOTAL PROTEIN: 6.7 g/dL (ref 6.5–8.1)

## 2018-08-25 LAB — WET PREP, GENITAL
Clue Cells Wet Prep HPF POC: NONE SEEN
Sperm: NONE SEEN
Trich, Wet Prep: NONE SEEN
WBC, Wet Prep HPF POC: NONE SEEN
Yeast Wet Prep HPF POC: NONE SEEN

## 2018-08-25 LAB — I-STAT BETA HCG BLOOD, ED (MC, WL, AP ONLY)

## 2018-08-25 LAB — URINALYSIS, ROUTINE W REFLEX MICROSCOPIC
BILIRUBIN URINE: NEGATIVE
GLUCOSE, UA: NEGATIVE mg/dL
Hgb urine dipstick: NEGATIVE
KETONES UR: NEGATIVE mg/dL
Leukocytes, UA: NEGATIVE
NITRITE: NEGATIVE
PH: 7 (ref 5.0–8.0)
PROTEIN: NEGATIVE mg/dL
Specific Gravity, Urine: 1.016 (ref 1.005–1.030)

## 2018-08-25 LAB — CBC
HCT: 39.8 % (ref 36.0–46.0)
HEMOGLOBIN: 12.5 g/dL (ref 12.0–15.0)
MCH: 28.4 pg (ref 26.0–34.0)
MCHC: 31.4 g/dL (ref 30.0–36.0)
MCV: 90.5 fL (ref 80.0–100.0)
Platelets: 296 10*3/uL (ref 150–400)
RBC: 4.4 MIL/uL (ref 3.87–5.11)
RDW: 14.1 % (ref 11.5–15.5)
WBC: 5 10*3/uL (ref 4.0–10.5)
nRBC: 0 % (ref 0.0–0.2)

## 2018-08-25 MED ORDER — LIDOCAINE HCL (PF) 1 % IJ SOLN
INTRAMUSCULAR | Status: AC
  Administered 2018-08-25: 0.9 mL
  Filled 2018-08-25: qty 5

## 2018-08-25 MED ORDER — ONDANSETRON 4 MG PO TBDP
4.0000 mg | ORAL_TABLET | Freq: Once | ORAL | Status: AC
  Administered 2018-08-25: 4 mg via ORAL
  Filled 2018-08-25: qty 1

## 2018-08-25 MED ORDER — AZITHROMYCIN 250 MG PO TABS
1000.0000 mg | ORAL_TABLET | Freq: Once | ORAL | Status: AC
  Administered 2018-08-25: 1000 mg via ORAL
  Filled 2018-08-25: qty 4

## 2018-08-25 MED ORDER — CEFTRIAXONE SODIUM 250 MG IJ SOLR
250.0000 mg | Freq: Once | INTRAMUSCULAR | Status: AC
  Administered 2018-08-25: 250 mg via INTRAMUSCULAR
  Filled 2018-08-25: qty 250

## 2018-08-25 NOTE — ED Triage Notes (Addendum)
Pt reports lower abd pain/pelvic pain that started two weeks ago. Pt reports clear discharge with no odor. Also reports lightheadedness and dizziness. Pt reports dysuria and frequency. LMP 1 week ago, reports spotting and states her periods have been irregular and shorter lately. Denies N/V/D. Hx of PID.

## 2018-08-25 NOTE — Discharge Instructions (Addendum)
Please read attached information. If you experience any new or worsening signs or symptoms please return to the emergency room for evaluation. Please follow-up with your primary care provider or specialist as discussed.  °

## 2018-08-25 NOTE — ED Provider Notes (Signed)
Patient placed in Quick Look pathway, seen and evaluated   Chief Complaint: pelvic pain  HPI:   Lower pelvic pain x 2 weeks worse on R. H/o PID and BV. Sexually active with female partners without condom use. No abd surgeries.   ROS:  Positive: clear vaginal discharge, urinary urgency and pain with urination. Negative: fevers, chills, nausea, vomiting.   Physical Exam:   Gen: No distress  Neuro: Awake and Alert  Skin: Warm    Focused Exam: mild diffuse very low abd tenderness. Negative mcburney's.    Initiation of care has begun. The patient has been counseled on the process, plan, and necessity for staying for the completion/evaluation, and the remainder of the medical screening examination    Jerrell Mylar 08/25/18 1632    Azalia Bilis, MD 08/25/18 [phone]

## 2018-08-25 NOTE — ED Provider Notes (Signed)
[facility] EMERGENCY DEPARTMENT Provider Note   CSN: [id number] Arrival date & time: 08/25/18  1545     History   Chief Complaint Chief Complaint  Patient presents with  . Pelvic Pain    HPI Tammy Sanchez is a 22 y.o. female.  HPI   60 YOF presents today with complaints of pelvic pain and discharge. She notes several episodes of discharge and pelvic pain that she was treated for pelvic inflammatory disease.  She notes several time she was given doxycycline but did not finish the course.  Most recently in August she was started on metronidazole and doxycycline and took approximately 1 weeks worth of the medication.  She notes symptoms did go away, but then started again approximately 2 weeks ago.  She notes discharge, pelvic pain, she denies any upper abdominal pain fever chills nausea or vomiting.  Patient reports she is sexually active with female partner without condoms.    Past Medical History:  Diagnosis Date  . Bacterial vaginosis     Patient Active Problem List   Diagnosis Date Noted  . PID (acute pelvic inflammatory disease) 09/12/2017    History reviewed. No pertinent surgical history.   OB History    Gravida  0   Para  0   Term  0   Preterm  0   AB  0   Living  0     SAB  0   TAB  0   Ectopic  0   Multiple  0   Live Births  0            Home Medications    Prior to Admission medications   Medication Sig Start Date End Date Taking? Authorizing Provider  diclofenac (VOLTAREN) 75 MG EC tablet Take 1 tablet (75 mg total) by mouth 2 (two) times daily with a meal. Patient not taking: Reported on 11/29/2017 08/08/17   Leftwich-Kirby, Wilmer Floor, CNM  doxycycline (VIBRAMYCIN) 100 MG capsule Take 1 capsule (100 mg total) by mouth 2 (two) times daily. Patient not taking: Reported on 08/25/2018 11/29/17   Derwood Kaplan, MD  ibuprofen (ADVIL,MOTRIN) 200 MG tablet Take 200 mg by mouth every 6 (six) hours as needed for headache (pain).     [provider]  nystatin ointment (MYCOSTATIN) Apply 1 application topically 2 (two) times daily. Patient not taking: Reported on 11/29/2017 08/18/17   Sharen Counter A, CNM  triamcinolone ointment (KENALOG) 0.5 % Apply 1 application topically 2 (two) times daily. Patient not taking: Reported on 11/29/2017 08/18/17   Hurshel Party, CNM    Family History Family History  Problem Relation Age of Onset  . Diabetes Paternal Grandmother     Social History Social History   Tobacco Use  . Smoking status: Never Smoker  . Smokeless tobacco: Never Used  Substance Use Topics  . Alcohol use: No  . Drug use: No     Allergies   Patient has no known allergies.  Review of Systems Review of Systems  All other systems reviewed and are negative.  Physical Exam Updated Vital Signs BP 108/62 (BP Location: Right Arm)   Pulse 79   Temp 97.8 F (36.6 C) (Oral)   Resp 18   Ht 5\' 1"  (1.549 m)   Wt 52.2 kg   LMP 08/19/2018   SpO2 99%   BMI 21.73 kg/m   Physical Exam  Constitutional: She is oriented to person, place, and time. She appears well-developed and well-nourished.  HENT:  Head: Normocephalic and atraumatic.  Eyes: Pupils are equal, round, and reactive to light. Conjunctivae are normal. Right eye exhibits no discharge. Left eye exhibits no discharge. No scleral icterus.  Neck: Normal range of motion. No JVD present. No tracheal deviation present.  Pulmonary/Chest: Effort normal. No stridor.  Abdominal: Soft. She exhibits no distension and no mass. There is no tenderness. There is no rebound and no guarding. No hernia.  Genitourinary:  Genitourinary Comments: Clumpy white/yellowish discharge noted in vaginal vault, no cervical motion tenderness or adnexal tenderness or masses   Neurological: She is alert and oriented to person, place, and time. Coordination normal.  Psychiatric: She has a normal mood and affect. Her behavior is normal. Judgment and thought  content normal.  Nursing note and vitals reviewed.    ED Treatments / Results  Labs (all labs ordered are listed, but only abnormal results are displayed) Labs Reviewed  URINALYSIS, ROUTINE W REFLEX MICROSCOPIC - Abnormal; Notable for the following components:      Result Value   APPearance HAZY (*)    All other components within normal limits  COMPREHENSIVE METABOLIC PANEL - Abnormal; Notable for the following components:   Glucose, Bld 121 (*)    All other components within normal limits  WET PREP, GENITAL  CBC  I-STAT BETA HCG BLOOD, ED ([facility], [facility], [facility] ONLY)  GC/CHLAMYDIA PROBE AMP ([place]) NOT AT [facility]    EKG None  Radiology No results found.  Procedures Procedures (including critical care time)  Medications Ordered in ED Medications  cefTRIAXone (ROCEPHIN) injection 250 mg (250 mg Intramuscular Given 08/25/18 1850)  azithromycin (ZITHROMAX) tablet 1,000 mg (1,000 mg Oral Given 08/25/18 1847)  ondansetron (ZOFRAN-ODT) disintegrating tablet 4 mg (4 mg Oral Given 08/25/18 1848)  lidocaine (PF) (XYLOCAINE) 1 % injection (0.9 mLs  Given 08/25/18 1849)     Initial Impression / Assessment and Plan / ED Course  I have reviewed the triage vital signs and the nursing notes.  Pertinent labs & imaging results that were available during my care of the patient were reviewed by me and considered in my medical decision making (see chart for details).     Labs: Wet prep, i-STAT beta-hCG, urinalysis, GC CMP, CBC  Imaging:  Consults:  Therapeutics: Ceftriaxone, azithromycin, Zofran  Discharge Meds:   Assessment/Plan: 22 year old female presents today with vaginal discharge.  Patient has had several treatment courses including metronidazole and doxy and has been unsuccessful at completing these.  Patient having discharge here today, she has no signs of pelvic inflammatory disease, she will be treated with ceftriaxone and azithromycin.  Wet prep shows no additional findings  although I have suspicion that this is not accurate given her physical exam.  I do feel patient is stable for outpatient management, I have encouraged her to follow-up with the health department or women's health for repeat evaluation, she will return immediately if she develops any new or worsening signs or symptoms.  I encouraged her to refrain from sexual activity until symptoms resolved.  She verbalized understanding and agreement to today's plan had no further questions or concerns.   Final Clinical Impressions(s) / ED Diagnoses   Final diagnoses:  Vaginal discharge    ED Discharge Orders    None       Rosalio Loud 08/25/18 1939    Doug Sou, MD 08/25/18 2248

## 2018-08-26 LAB — GC/CHLAMYDIA PROBE AMP (~~LOC~~) NOT AT ARMC
Chlamydia: NEGATIVE
Neisseria Gonorrhea: NEGATIVE

## 2018-08-30 IMAGING — CR DG CHEST 2V
2 series · 2 of 2 positions shown · non-contrast
Comparison: 10/07/2016

CLINICAL DATA: Chest pain

EXAM:
CHEST  2 VIEW

[chest pa]
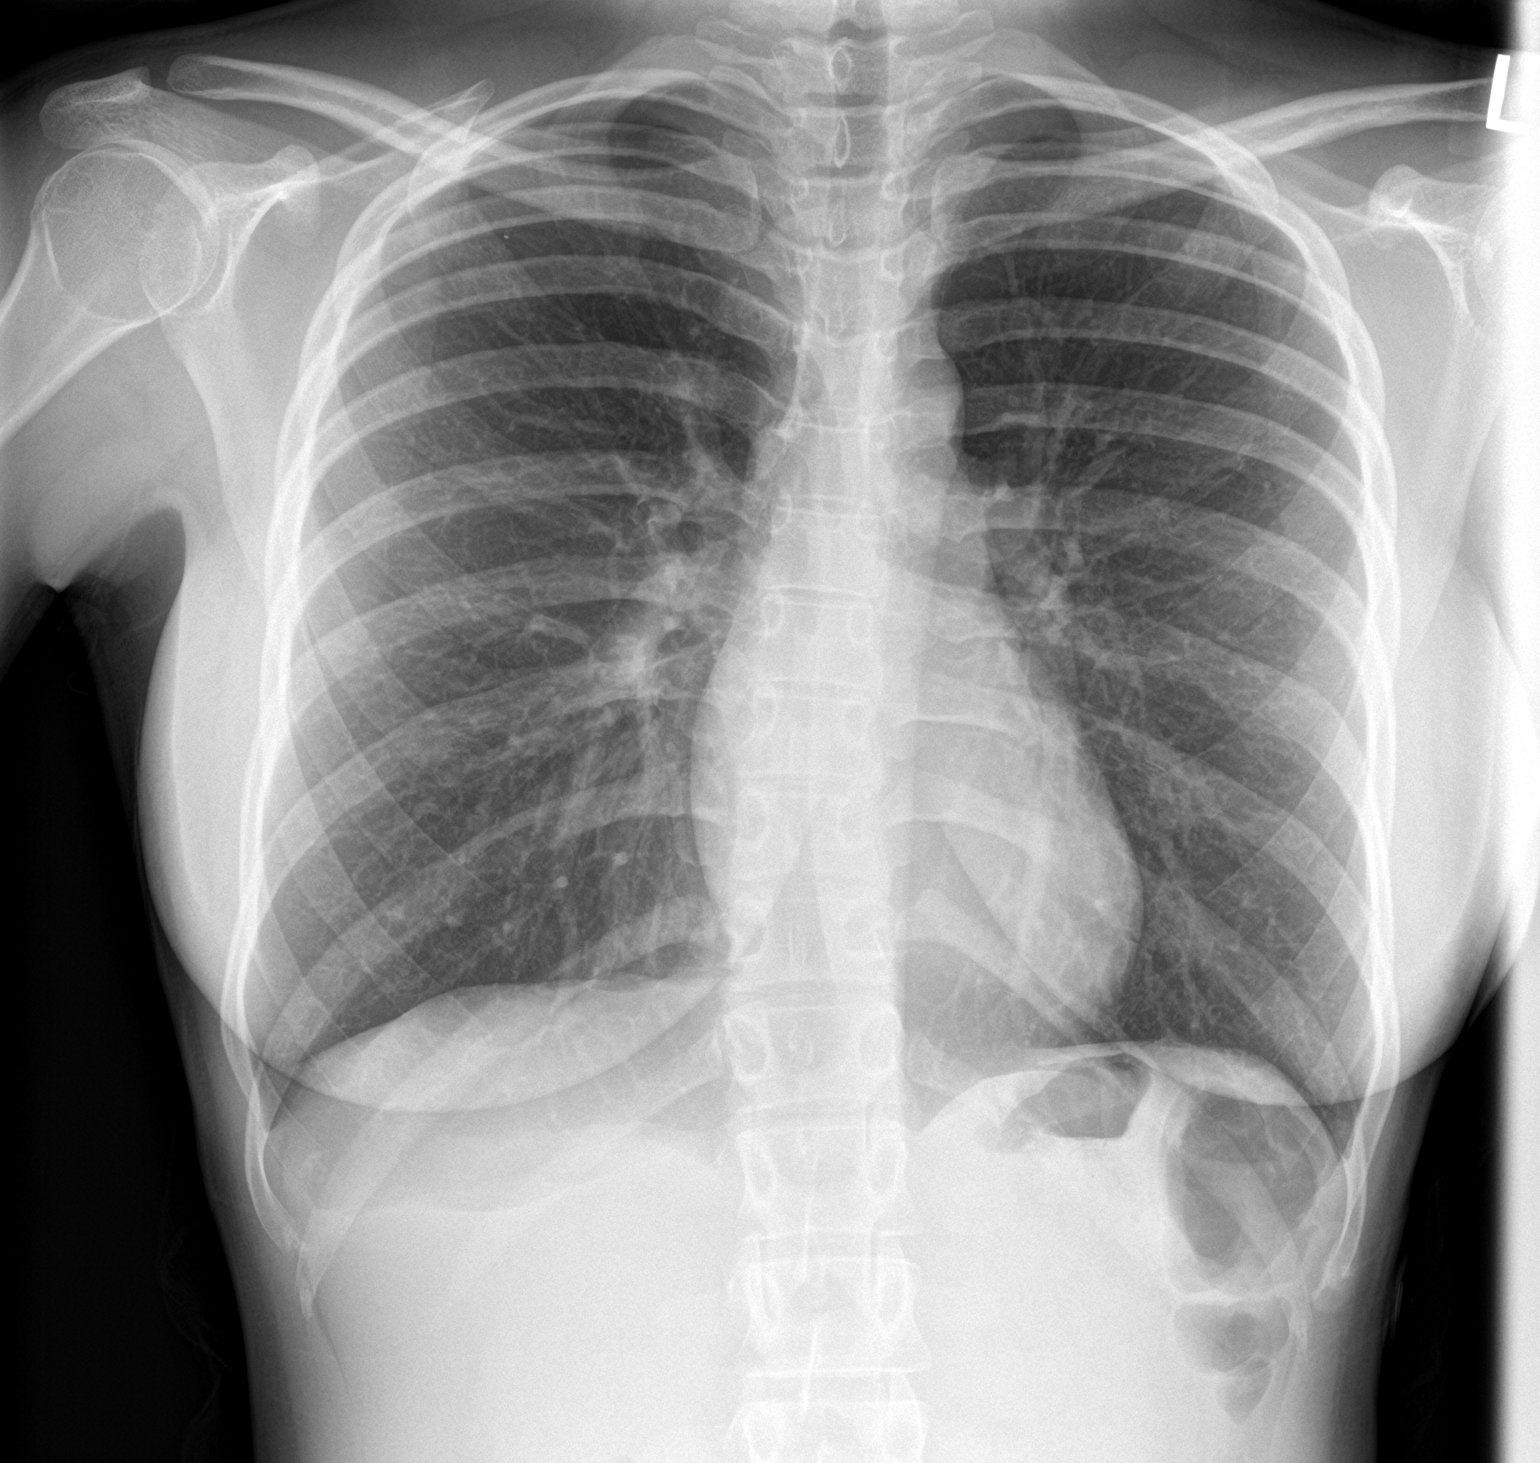

[chest lat]
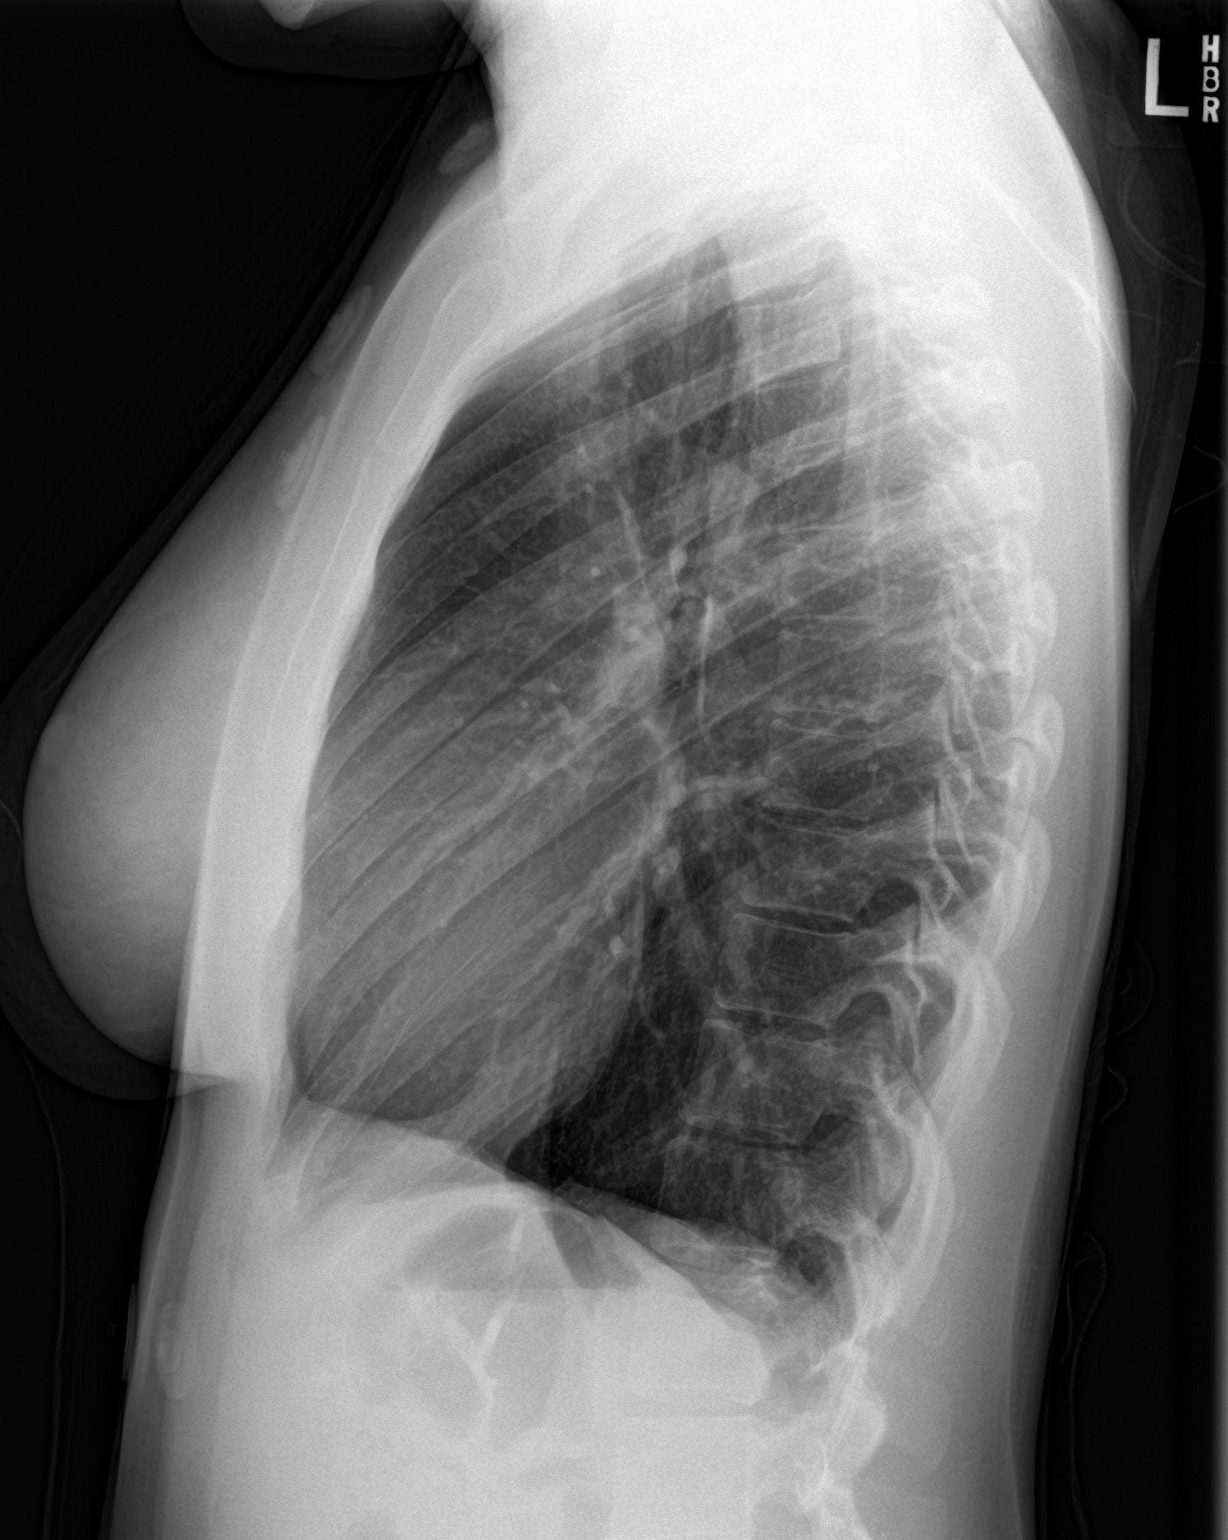

[2 of 2 positions shown; findings below may reference images not displayed]

FINDINGS: Normal heart size. Lungs hyperaerated and clear. No pneumothorax. No
pleural effusion.
IMPRESSION: No active cardiopulmonary disease.

## 2021-06-18 ENCOUNTER — Emergency Department (HOSPITAL_BASED_OUTPATIENT_CLINIC_OR_DEPARTMENT_OTHER)
Admission: EM | Admit: 2021-06-18 | Discharge: 2021-06-18 | Disposition: A | Discharge status: Home / Self Care | Payer: Self-pay | Attending: Emergency Medicine | Admitting: Emergency Medicine

## 2021-06-18 ENCOUNTER — Other Ambulatory Visit: Payer: Self-pay

## 2021-06-18 ENCOUNTER — Encounter (HOSPITAL_BASED_OUTPATIENT_CLINIC_OR_DEPARTMENT_OTHER): Payer: Self-pay

## 2021-06-18 DIAGNOSIS — N760 Acute vaginitis: Secondary | ICD-10-CM | POA: Insufficient documentation

## 2021-06-18 DIAGNOSIS — B9689 Other specified bacterial agents as the cause of diseases classified elsewhere: Secondary | ICD-10-CM | POA: Insufficient documentation

## 2021-06-18 DIAGNOSIS — G8929 Other chronic pain: Secondary | ICD-10-CM | POA: Insufficient documentation

## 2021-06-18 LAB — URINALYSIS, ROUTINE W REFLEX MICROSCOPIC
Bilirubin Urine: NEGATIVE
Glucose, UA: NEGATIVE mg/dL
Hgb urine dipstick: NEGATIVE
Ketones, ur: NEGATIVE mg/dL
Leukocytes,Ua: NEGATIVE
Nitrite: NEGATIVE
Protein, ur: NEGATIVE mg/dL
Specific Gravity, Urine: >1.03 — ABNORMAL HIGH (ref 1.005–1.030)
pH: 6 (ref 5.0–8.0)

## 2021-06-18 LAB — CBC WITH DIFFERENTIAL/PLATELET
Abs Immature Granulocytes: 0.01 10*3/uL (ref 0.00–0.07)
Basophils Absolute: 0 10*3/uL (ref 0.0–0.1)
Basophils Relative: 1 %
Eosinophils Absolute: 0.1 10*3/uL (ref 0.0–0.5)
Eosinophils Relative: 2 %
HCT: 35.6 % — ABNORMAL LOW (ref 36.0–46.0)
Hemoglobin: 11.5 g/dL — ABNORMAL LOW (ref 12.0–15.0)
Immature Granulocytes: 0 %
Lymphocytes Relative: 39 %
Lymphs Abs: 1.6 10*3/uL (ref 0.7–4.0)
MCH: 28.7 pg (ref 26.0–34.0)
MCHC: 32.3 g/dL (ref 30.0–36.0)
MCV: 88.8 fL (ref 80.0–100.0)
Monocytes Absolute: 0.4 10*3/uL (ref 0.1–1.0)
Monocytes Relative: 11 %
Neutro Abs: 1.9 10*3/uL (ref 1.7–7.7)
Neutrophils Relative %: 47 %
Platelets: 294 10*3/uL (ref 150–400)
RBC: 4.01 MIL/uL (ref 3.87–5.11)
RDW: 14 % (ref 11.5–15.5)
WBC: 4 10*3/uL (ref 4.0–10.5)
nRBC: 0 % (ref 0.0–0.2)

## 2021-06-18 LAB — WET PREP, GENITAL
Sperm: NONE SEEN
Trich, Wet Prep: NONE SEEN
Yeast Wet Prep HPF POC: NONE SEEN

## 2021-06-18 LAB — COMPREHENSIVE METABOLIC PANEL
ALT: 8 U/L (ref 0–44)
AST: 14 U/L — ABNORMAL LOW (ref 15–41)
Albumin: 4.2 g/dL (ref 3.5–5.0)
Alkaline Phosphatase: 51 U/L (ref 38–126)
Anion gap: 6 (ref 5–15)
BUN: 10 mg/dL (ref 6–20)
CO2: 27 mmol/L (ref 22–32)
Calcium: 8.9 mg/dL (ref 8.9–10.3)
Chloride: 106 mmol/L (ref 98–111)
Creatinine, Ser: 0.66 mg/dL (ref 0.44–1.00)
GFR, Estimated: >60 mL/min (ref >60)
Glucose, Bld: 96 mg/dL (ref 70–99)
Potassium: 4 mmol/L (ref 3.5–5.1)
Sodium: 139 mmol/L (ref 135–145)
Total Bilirubin: 0.5 mg/dL (ref 0.3–1.2)
Total Protein: 7 g/dL (ref 6.5–8.1)

## 2021-06-18 LAB — LIPASE, BLOOD: Lipase: 26 U/L (ref 11–51)

## 2021-06-18 LAB — PREGNANCY, URINE: Preg Test, Ur: NEGATIVE

## 2021-06-18 MED ORDER — METRONIDAZOLE 0.75 % EX GEL: 1.0000 "application " | Freq: Two times a day (BID) | CUTANEOUS | 0 refills | Status: AC

## 2021-06-18 NOTE — ED Triage Notes (Signed)
Pt c/o lower abd pain and vaginal "spotting" x 2 weeks-NAD-steady gait

## 2021-06-18 NOTE — Discharge Instructions (Addendum)
I am prescribing you metronidazole gel.  Please apply this twice a day for the next 5 days.  Please check the results of your gonorrhea/chlamydia test on MyChart in the next 2 to 3 days.  Please refrain from sex until you receive these results.  If either test is positive you will need to follow-up at the local health department or emergency department to be treated.  If you develop any new or worsening symptoms please come back to the emergency department.  Below is the contact information for a local OB/GYN.  Please give them a call and schedule follow-up appointment.  It was a pleasure to meet you.

## 2021-06-18 NOTE — ED Notes (Signed)
Pelvic Cart to bedside 

## 2021-06-18 NOTE — ED Notes (Signed)
Patient reports pelvic pain without discharge, spotting, dysuria, history of PID.

## 2021-06-18 NOTE — ED Provider Notes (Signed)
[facility] EMERGENCY DEPARTMENT Provider Note   CSN: [id number] Arrival date & time: 06/18/21  1114     History Chief Complaint  Patient presents with   Abdominal Pain    Tammy Sanchez is a 25 y.o. female.  HPI Patient is a 25 year old female with a history of PID and bacterial vaginosis who presents to the emergency department with lower abdominal pain.  States that she has chronic lower abdominal pain.  States it is been worsening for the past 2 weeks.  It is intermittent.  No modifying factors.  Describes it as sharp.  Reports associated dysuria.  Also notes having her last normal menstrual cycle 2 weeks ago and has been intermittently spotting for the past 2 weeks.  States this is not normal for her.  Denies any vaginal discharge.  States that she has been sexually active with 2 female partners and does not use condoms with one of the partners.  No fevers, chills, nausea, vomiting, diarrhea, chest pain, shortness of breath.    Past Medical History:  Diagnosis Date   Bacterial vaginosis     Patient Active Problem List   Diagnosis Date Noted   PID (acute pelvic inflammatory disease) 09/12/2017    History reviewed. No pertinent surgical history.   OB History     Gravida  0   Para  0   Term  0   Preterm  0   AB  0   Living  0      SAB  0   IAB  0   Ectopic  0   Multiple  0   Live Births  0           Family History  Problem Relation Age of Onset   Diabetes Paternal Grandmother     Social History   Tobacco Use   Smoking status: Never   Smokeless tobacco: Never  Vaping Use   Vaping Use: Never used  Substance Use Topics   Alcohol use: No   Drug use: No    Home Medications Prior to Admission medications   Medication Sig Start Date End Date Taking? Authorizing Provider  metroNIDAZOLE (METROGEL) 0.75 % gel Apply 1 application topically 2 (two) times daily for 5 days. 06/18/21 06/23/21 Yes Placido Sou, PA-C  diclofenac (VOLTAREN) 75  MG EC tablet Take 1 tablet (75 mg total) by mouth 2 (two) times daily with a meal. Patient not taking: Reported on 11/29/2017 08/08/17   Leftwich-Kirby, Wilmer Floor, CNM  doxycycline (VIBRAMYCIN) 100 MG capsule Take 1 capsule (100 mg total) by mouth 2 (two) times daily. Patient not taking: Reported on 08/25/2018 11/29/17   Derwood Kaplan, MD  ibuprofen (ADVIL,MOTRIN) 200 MG tablet Take 200 mg by mouth every 6 (six) hours as needed for headache (pain).    [provider]  nystatin ointment (MYCOSTATIN) Apply 1 application topically 2 (two) times daily. Patient not taking: Reported on 11/29/2017 08/18/17   Sharen Counter A, CNM  triamcinolone ointment (KENALOG) 0.5 % Apply 1 application topically 2 (two) times daily. Patient not taking: Reported on 11/29/2017 08/18/17   Hurshel Party, CNM    Allergies    Patient has no known allergies.  Review of Systems   Review of Systems  All other systems reviewed and are negative. Ten systems reviewed and are negative for acute change, except as noted in the HPI.   Physical Exam Updated Vital Signs BP 117/64 (BP Location: Right Arm)   Pulse 81   Temp 98.6  F (37 C) (Oral)   Resp 18   Ht 5\' 1"  (1.549 m)   Wt 48.5 kg   SpO2 99%   BMI 20.22 kg/m   Physical Exam Vitals and nursing note reviewed.  Constitutional:      General: She is not in acute distress.    Appearance: Normal appearance. She is not ill-appearing, toxic-appearing or diaphoretic.  HENT:     Head: Normocephalic and atraumatic.     Right Ear: External ear normal.     Left Ear: External ear normal.     Nose: Nose normal.     Mouth/Throat:     Mouth: Mucous membranes are moist.     Pharynx: Oropharynx is clear. No oropharyngeal exudate or posterior oropharyngeal erythema.  Eyes:     Extraocular Movements: Extraocular movements intact.  Cardiovascular:     Rate and Rhythm: Normal rate and regular rhythm.     Pulses: Normal pulses.     Heart sounds: Normal heart  sounds. No murmur heard.   No friction rub. No gallop.  Pulmonary:     Effort: Pulmonary effort is normal. No respiratory distress.     Breath sounds: Normal breath sounds. No stridor. No wheezing, rhonchi or rales.  Abdominal:     General: Abdomen is flat.     Palpations: Abdomen is soft.     Tenderness: There is no abdominal tenderness.     Comments: Abdomen is soft.  Mild tenderness noted diffusely along the lower abdomen.    Genitourinary:    Comments: Female nursing chaperone present.  Normal-appearing vulvar anatomy.  Normal-appearing vaginal mucosa.  Closed cervical os.  No cervical motion tenderness.  No adnexal tenderness.  Small amount of white discharge noted in the vaginal vault.  No bleeding. Musculoskeletal:        General: Normal range of motion.     Cervical back: Normal range of motion and neck supple. No tenderness.  Skin:    General: Skin is warm and dry.  Neurological:     General: No focal deficit present.     Mental Status: She is alert and oriented to person, place, and time.  Psychiatric:        Mood and Affect: Mood normal.        Behavior: Behavior normal.   ED Results / Procedures / Treatments   Labs (all labs ordered are listed, but only abnormal results are displayed) Labs Reviewed  WET PREP, GENITAL - Abnormal; Notable for the following components:      Result Value   Clue Cells Wet Prep HPF POC PRESENT (*)    WBC, Wet Prep HPF POC FEW (*)    All other components within normal limits  URINALYSIS, ROUTINE W REFLEX MICROSCOPIC - Abnormal; Notable for the following components:   Specific Gravity, Urine >1.030 (*)    All other components within normal limits  COMPREHENSIVE METABOLIC PANEL - Abnormal; Notable for the following components:   AST 14 (*)    All other components within normal limits  CBC WITH DIFFERENTIAL/PLATELET - Abnormal; Notable for the following components:   Hemoglobin 11.5 (*)    HCT 35.6 (*)    All other components within normal  limits  PREGNANCY, URINE  LIPASE, BLOOD  GC/CHLAMYDIA PROBE AMP ([place]) NOT AT [facility]    EKG None  Radiology No results found.  Procedures Procedures   Medications Ordered in ED Medications - No data to display  ED Course  I have reviewed the triage vital  signs and the nursing notes.  Pertinent labs & imaging results that were available during my care of the patient were reviewed by me and considered in my medical decision making (see chart for details).    MDM Rules/Calculators/A&P                          Pt is a 25 y.o. female who presents to the emergency department with chronic pelvic pain.    Labs: CBC with a hemoglobin of 11.5 and hematocrit of 35.6. CMP with an AST of 14. UA with an elevated specific gravity but otherwise no abnormalities. Wet prep shows clue cells as well as few white blood cells. Lipase within normal limits at 26. GC/chlamydia is pending.  I, Placido Sou, PA-C, personally reviewed and evaluated these images and lab results as part of my medical decision-making.  Physical exam significant for a small amount of tenderness noted in the pelvic region.  Patient had a reassuring pelvic exam.  No cervical motion tenderness or adnexal tenderness.  No bleeding or increased friability.  Small amount of white vaginal discharge but otherwise no abnormalities.    Lab work mostly reassuring.  No leukocytosis or neutrophilia.  Patient afebrile and not tachycardic.  Doubt infectious etiology.  Wet prep with few white blood cells as well as clue cells.  Patient notes recurrent BV in the past.  We will treat with metronidazole.  Recommended patient refrain from intercourse until she receives the results of her gonorrhea/chlamydia test.  Patient understands she will need to return for treatment either to the emergency department or the health department if either these test results are positive.  She was given a referral to a local OB/GYN and she is planning  on following up with them.  Feel the patient is stable for discharge at this time and she is agreeable.  Her questions were answered and she was amicable at the time of discharge.  Note: Portions of this report may have been transcribed using voice recognition software. Every effort was made to ensure accuracy; however, inadvertent computerized transcription errors may be present.   Final Clinical Impression(s) / ED Diagnoses Final diagnoses:  BV (bacterial vaginosis)   Rx / DC Orders ED Discharge Orders          Ordered    metroNIDAZOLE (METROGEL) 0.75 % gel  2 times daily        06/18/21 1341             Placido Sou, PA-C 06/18/21 1406    Jacalyn Lefevre, MD 06/18/21 1512

## 2021-06-19 LAB — GC/CHLAMYDIA PROBE AMP (~~LOC~~) NOT AT ARMC
Chlamydia: NEGATIVE
Comment: NEGATIVE
Comment: NORMAL
Neisseria Gonorrhea: NEGATIVE

## 2021-07-30 ENCOUNTER — Encounter (HOSPITAL_BASED_OUTPATIENT_CLINIC_OR_DEPARTMENT_OTHER): Payer: Self-pay

## 2021-07-30 ENCOUNTER — Other Ambulatory Visit: Payer: Self-pay

## 2021-07-30 DIAGNOSIS — N898 Other specified noninflammatory disorders of vagina: Secondary | ICD-10-CM | POA: Insufficient documentation

## 2021-07-30 NOTE — ED Triage Notes (Signed)
Pt c/o vaginal d/c "that looked like clots and pieces of rubber like skin" started 930pm-states she completed 5 days course of gel to treat BV on 9/8-NAD-steady gait

## 2021-07-31 ENCOUNTER — Emergency Department (HOSPITAL_BASED_OUTPATIENT_CLINIC_OR_DEPARTMENT_OTHER)
Admission: EM | Admit: 2021-07-31 | Discharge: 2021-07-31 | Disposition: A | Discharge status: Home / Self Care | Payer: Medicaid Other | Attending: Emergency Medicine | Admitting: Emergency Medicine

## 2021-08-22 ENCOUNTER — Other Ambulatory Visit: Payer: Self-pay

## 2021-08-22 ENCOUNTER — Encounter (HOSPITAL_BASED_OUTPATIENT_CLINIC_OR_DEPARTMENT_OTHER): Payer: Self-pay

## 2021-08-22 ENCOUNTER — Emergency Department (HOSPITAL_BASED_OUTPATIENT_CLINIC_OR_DEPARTMENT_OTHER): Payer: Self-pay

## 2021-08-22 ENCOUNTER — Emergency Department (HOSPITAL_BASED_OUTPATIENT_CLINIC_OR_DEPARTMENT_OTHER)
Admission: EM | Admit: 2021-08-22 | Discharge: 2021-08-22 | Disposition: A | Discharge status: Home / Self Care | Payer: Self-pay | Attending: Emergency Medicine | Admitting: Emergency Medicine

## 2021-08-22 DIAGNOSIS — N939 Abnormal uterine and vaginal bleeding, unspecified: Secondary | ICD-10-CM | POA: Insufficient documentation

## 2021-08-22 DIAGNOSIS — N930 Postcoital and contact bleeding: Secondary | ICD-10-CM

## 2021-08-22 DIAGNOSIS — N7011 Chronic salpingitis: Secondary | ICD-10-CM | POA: Insufficient documentation

## 2021-08-22 DIAGNOSIS — R102 Pelvic and perineal pain: Secondary | ICD-10-CM

## 2021-08-22 LAB — CBC WITH DIFFERENTIAL/PLATELET
Abs Immature Granulocytes: 0.01 10*3/uL (ref 0.00–0.07)
Basophils Absolute: 0 10*3/uL (ref 0.0–0.1)
Basophils Relative: 0 %
Eosinophils Absolute: 0.1 10*3/uL (ref 0.0–0.5)
Eosinophils Relative: 3 %
HCT: 39.3 % (ref 36.0–46.0)
Hemoglobin: 12.7 g/dL (ref 12.0–15.0)
Immature Granulocytes: 0 %
Lymphocytes Relative: 37 %
Lymphs Abs: 1.6 10*3/uL (ref 0.7–4.0)
MCH: 28.2 pg (ref 26.0–34.0)
MCHC: 32.3 g/dL (ref 30.0–36.0)
MCV: 87.1 fL (ref 80.0–100.0)
Monocytes Absolute: 0.4 10*3/uL (ref 0.1–1.0)
Monocytes Relative: 10 %
Neutro Abs: 2.2 10*3/uL (ref 1.7–7.7)
Neutrophils Relative %: 50 %
Platelets: 288 10*3/uL (ref 150–400)
RBC: 4.51 MIL/uL (ref 3.87–5.11)
RDW: 15.6 % — ABNORMAL HIGH (ref 11.5–15.5)
WBC: 4.5 10*3/uL (ref 4.0–10.5)
nRBC: 0 % (ref 0.0–0.2)

## 2021-08-22 LAB — URINALYSIS, ROUTINE W REFLEX MICROSCOPIC
Bilirubin Urine: NEGATIVE
Glucose, UA: NEGATIVE mg/dL
Hgb urine dipstick: NEGATIVE
Ketones, ur: NEGATIVE mg/dL
Leukocytes,Ua: NEGATIVE
Nitrite: NEGATIVE
Protein, ur: NEGATIVE mg/dL
Specific Gravity, Urine: 1.03 (ref 1.005–1.030)
pH: 5.5 (ref 5.0–8.0)

## 2021-08-22 LAB — BASIC METABOLIC PANEL
Anion gap: 5 (ref 5–15)
BUN: 13 mg/dL (ref 6–20)
CO2: 27 mmol/L (ref 22–32)
Calcium: 9.2 mg/dL (ref 8.9–10.3)
Chloride: 106 mmol/L (ref 98–111)
Creatinine, Ser: 0.69 mg/dL (ref 0.44–1.00)
GFR, Estimated: >60 mL/min (ref >60)
Glucose, Bld: 70 mg/dL (ref 70–99)
Potassium: 3.3 mmol/L — ABNORMAL LOW (ref 3.5–5.1)
Sodium: 138 mmol/L (ref 135–145)

## 2021-08-22 LAB — PREGNANCY, URINE: Preg Test, Ur: NEGATIVE

## 2021-08-22 LAB — WET PREP, GENITAL
Sperm: NONE SEEN
Trich, Wet Prep: NONE SEEN
Yeast Wet Prep HPF POC: NONE SEEN

## 2021-08-22 NOTE — ED Notes (Signed)
Pt declined IV, requested straight stick for lab work

## 2021-08-22 NOTE — ED Triage Notes (Addendum)
Patient reports ongoing pelvic pain with abnormal bleeding that patient states has been going on for around a year per patient.

## 2021-08-22 NOTE — Discharge Instructions (Addendum)
Your lab work today was overall reassuring.  Your wet prep did still show signs of BV, you can continue using metronidazole gel as directed.  You do have repeat STD testing pending will be called by phone if any of these tests are negative.  Your ultrasound showed a hydrosalpinx this is a blockage at the end of your fallopian tube, it can be caused by scarring and inflammation from previous PID infections.  You will need to follow-up with OB/GYN regarding this it could potentially be contributing to your pain.  Since you are having persistent pain in particular with intercourse as well as bleeding afterwards it is very very important that she follow-up with gynecology for further evaluation of this.  Please call today to schedule follow-up appointment.  Return to the ED for new or worsening symptoms.

## 2021-08-22 NOTE — ED Provider Notes (Signed)
[facility] EMERGENCY DEPARTMENT Provider Note   CSN: [id number] Arrival date & time: 08/22/21  8315     History Chief Complaint  Patient presents with   Vaginal Bleeding   Pelvic Pain    Tammy Sanchez is a 25 y.o. female.  Tammy Sanchez is a 25 y.o. female with past medical history significant for PID and BV who presents today for pelvic pain and vaginal bleeding. She describes the pain as an intermittent sharp pain that occurs every 2-3 days and will last a few hours.  Pain located primarily in the suprapubic region.  The pain is worse with sexual intercourse and the day after intercourse. Bleeding occurs during and after sexual intercourse as well.  She reports bleeding is very light and spotting, and otherwise she has normal menstrual cycle.  This has been recurring for a year now. Denies vaginal discharge or odorous discharge. Denies pain or burning with urination. Patient reports two sexual partners in the past few months. She is not on birth control but uses condoms. LMP 9/17 which lasted 5 days. Patient was last treated for BV 3 weeks ago with metronidazole gel. She was then evaluated at the health department last week and reports she had STD testing done which was negative.  Patient has never been seen evaluated OB/GYN regarding the symptoms has never previously had a Pap smear, no personal or family history of gynecologic cancers.  The history is provided by the patient and medical records.      Past Medical History:  Diagnosis Date   Bacterial vaginosis     Patient Active Problem List   Diagnosis Date Noted   PID (acute pelvic inflammatory disease) 09/12/2017    History reviewed. No pertinent surgical history.   OB History     Gravida  0   Para  0   Term  0   Preterm  0   AB  0   Living  0      SAB  0   IAB  0   Ectopic  0   Multiple  0   Live Births  0           Family History  Problem Relation Age of Onset   Diabetes Paternal  Grandmother     Social History   Tobacco Use   Smoking status: Never   Smokeless tobacco: Never  Vaping Use   Vaping Use: Never used  Substance Use Topics   Alcohol use: No   Drug use: No    Home Medications Prior to Admission medications   Medication Sig Start Date End Date Taking? Authorizing Provider  diclofenac (VOLTAREN) 75 MG EC tablet Take 1 tablet (75 mg total) by mouth 2 (two) times daily with a meal. Patient not taking: Reported on 11/29/2017 08/08/17   Leftwich-Kirby, Wilmer Floor, CNM  doxycycline (VIBRAMYCIN) 100 MG capsule Take 1 capsule (100 mg total) by mouth 2 (two) times daily. Patient not taking: Reported on 08/25/2018 11/29/17   Derwood Kaplan, MD  ibuprofen (ADVIL,MOTRIN) 200 MG tablet Take 200 mg by mouth every 6 (six) hours as needed for headache (pain).    [provider]  nystatin ointment (MYCOSTATIN) Apply 1 application topically 2 (two) times daily. Patient not taking: Reported on 11/29/2017 08/18/17   Sharen Counter A, CNM  triamcinolone ointment (KENALOG) 0.5 % Apply 1 application topically 2 (two) times daily. Patient not taking: Reported on 11/29/2017 08/18/17   Hurshel Party, CNM    Allergies  Patient has no known allergies.  Review of Systems   Review of Systems  Constitutional:  Negative for chills and fever.  HENT: Negative.    Respiratory:  Negative for cough and shortness of breath.   Cardiovascular:  Negative for chest pain.  Gastrointestinal:  Negative for abdominal pain.  Genitourinary:  Positive for pelvic pain and vaginal bleeding. Negative for dysuria, flank pain and frequency.  Musculoskeletal:  Negative for back pain and myalgias.  Skin:  Negative for color change and rash.  Neurological:  Negative for dizziness, syncope and light-headedness.  All other systems reviewed and are negative.  Physical Exam Updated Vital Signs BP 103/65 (BP Location: Right Arm)   Pulse 86   Temp 98.5 F (36.9 C) (Oral)   Resp 16    Ht 5\' 1"  (1.549 m)   Wt 49.9 kg   SpO2 100%   BMI 20.78 kg/m   Physical Exam Vitals and nursing note reviewed.  Constitutional:      General: She is not in acute distress.    Appearance: Normal appearance. She is well-developed. She is not diaphoretic.  HENT:     Head: Normocephalic and atraumatic.  Eyes:     General:        Right eye: No discharge.        Left eye: No discharge.     Pupils: Pupils are equal, round, and reactive to light.  Cardiovascular:     Rate and Rhythm: Normal rate and regular rhythm.     Pulses: Normal pulses.     Heart sounds: Normal heart sounds.  Pulmonary:     Effort: Pulmonary effort is normal. No respiratory distress.     Breath sounds: Normal breath sounds. No wheezing or rales.     Comments: Respirations equal and unlabored, patient able to speak in full sentences, lungs clear to auscultation bilaterally  Abdominal:     General: Bowel sounds are normal. There is no distension.     Palpations: Abdomen is soft. There is no mass.     Tenderness: There is abdominal tenderness. There is no guarding.     Comments: Abdomen soft, nondistended, mild suprapubic tenderness, no guarding or peritoneal signs, otherwise abdomen is nontender, no CVA tenderness  Genitourinary:    Comments: Chaperone present during pelvic exam. No external genital lesions noted, no lymphadenopathy. On speculum exam patient without discharge or bleeding.  Cervix appears normal, with swab patient did have a small amount of cervical bleeding. On bimanual exam patient without cervical motion tenderness, she did have some uterine tenderness but no focal adnexal tenderness.  No palpable masses. Musculoskeletal:        General: No deformity.     Cervical back: Neck supple.  Skin:    General: Skin is warm and dry.     Capillary Refill: Capillary refill takes less than 2 seconds.  Neurological:     Mental Status: She is alert and oriented to person, place, and time.      Coordination: Coordination normal.     Comments: Speech is clear, able to follow commands Moves extremities without ataxia, coordination intact  Psychiatric:        Mood and Affect: Mood normal.        Behavior: Behavior normal.    ED Results / Procedures / Treatments   Labs (all labs ordered are listed, but only abnormal results are displayed) Labs Reviewed  WET PREP, GENITAL - Abnormal; Notable for the following components:      Result  Value   Clue Cells Wet Prep HPF POC PRESENT (*)    WBC, Wet Prep HPF POC MODERATE (*)    All other components within normal limits  BASIC METABOLIC PANEL - Abnormal; Notable for the following components:   Potassium 3.3 (*)    All other components within normal limits  CBC WITH DIFFERENTIAL/PLATELET - Abnormal; Notable for the following components:   RDW 15.6 (*)    All other components within normal limits  URINALYSIS, ROUTINE W REFLEX MICROSCOPIC - Abnormal; Notable for the following components:   APPearance HAZY (*)    All other components within normal limits  PREGNANCY, URINE  GC/CHLAMYDIA PROBE AMP ([employer]) NOT AT [facility]    EKG None  Radiology US PELVIC COMPLETE W TRANSVAGINAL AND TORSION R/O  Result Date: 08/22/2021 CLINICAL DATA:  Pelvic pain for 1 year EXAM: TRANSABDOMINAL AND TRANSVAGINAL ULTRASOUND OF PELVIS DOPPLER ULTRASOUND OF OVARIES TECHNIQUE: Both transabdominal and transvaginal ultrasound examinations of the pelvis were performed. Transabdominal technique was performed for global imaging of the pelvis including uterus, ovaries, adnexal regions, and pelvic cul-de-sac. It was necessary to proceed with endovaginal exam following the transabdominal exam to visualize the uterus, endometrium, and ovaries. Color and duplex Doppler ultrasound was utilized to evaluate blood flow to the ovaries. COMPARISON:  Pelvic ultrasound 12/03/2018 FINDINGS: Uterus Measurements: 7.6 cm x 3.9 cm x 4.2 cm = volume: 65 mL. No fibroids or other mass  visualized. Endometrium Thickness: 15 mm.  No focal abnormality visualized. Right ovary Measurements: 3.9 cm x 2.6 cm x 3.1 cm = volume: 16.2 mL. Normal appearance/no adnexal mass. Left ovary Measurements: 3.3 cm x 2.5 cm x 3.4 cm = volume: 14.4 mL. The left ovary is normal in appearance. There is a tubular structure in the left adnexa measuring up to 4.9 cm x 3.0 cm on the current study likely reflecting hydrosalpinx, also present on the prior study from 12/03/2018. Pulsed Doppler evaluation of both ovaries demonstrates normal low-resistance arterial and venous waveforms. Other findings No abnormal free fluid. IMPRESSION: 1. Left hydrosalpinx, also present on the prior study from 12/03/2018. 2. Otherwise, unremarkable pelvic ultrasound. Electronically Signed   By: Lesia Hausen M.D.   On: 08/22/2021 12:57    Procedures Procedures   Medications Ordered in ED Medications - No data to display  ED Course  I have reviewed the triage vital signs and the nursing notes.  Pertinent labs & imaging results that were available during my care of the patient were reviewed by me and considered in my medical decision making (see chart for details).    MDM Rules/Calculators/A&P                           25 year old female presents with ongoing pelvic pain.  Symptoms have been occurring intermittently over the past year.  Pelvic pain is worse after intercourse and she also has spotting routinely after intercourse outside of her normal menstrual cycle.  Has been treated multiple times for BV and has prior history of PID but recently had negative STD testing last week.  Has had the same 2 sexual partners and routinely uses condoms but is not on any forms of contraception otherwise.  Has never followed up with gynecology regarding this pain has never had a Pap smear.  On exam she has some suprapubic and uterine tenderness, no bleeding although cervix was quite friable and with cervical swabs started to bleed slightly.   No palpable adnexal tenderness  or masses.  We will check basic labs and pelvic ultrasound given ongoing pelvic pain.  Ultimately patient will likely need gynecological follow-up.  I have independently ordered, reviewed and interpreted all labs and imaging: CBC: No leukocytosis, normal hemoglobin despite reported bleeding BMP: Minimal hypokalemia of 3.3, encourage patient to increase potassium intake in diet, no other electrolyte derangements, normal renal function UA: No hematuria or signs of infection Wet prep: Consistent with BV which patient is currently being treated for, moderate WBCs present but no significant discharge.  No other abnormalities.  Repeat GC/chlamydia testing is pending. Urine pregnancy: Negative  Pelvic ultrasound with left hydrosalpinx which was seen on previous study in 2020 as well.  Suspect this is likely a result of previous PID infection years ago.  This could potentially be contributing to her pain and she will need to follow-up with gynecology regarding this.  Her cervix was quite friable but I do not see signs of infection otherwise on her exam today, certainly think she would benefit from having a Pap smear.  I have stressed the importance of following up with gynecology and provided patient resources for follow-up.  She will continue using MetroGel for bacterial vaginosis.     At this time there does not appear to be any evidence of an acute emergency medical condition and the patient appears stable for discharge with appropriate outpatient follow up. Diagnosis was discussed with patient who verbalizes understanding and is agreeable to discharge.  Final Clinical Impression(s) / ED Diagnoses Final diagnoses:  Pelvic pain  Hydrosalpinx  Bleeding after intercourse    Rx / DC Orders ED Discharge Orders     None        Dartha Lodge, [place] 08/22/21 1439    Maia Plan, MD 08/27/21 [phone]

## 2021-08-23 ENCOUNTER — Encounter: Payer: Medicaid Other | Admitting: Family Medicine

## 2021-08-23 LAB — GC/CHLAMYDIA PROBE AMP (~~LOC~~) NOT AT ARMC
Chlamydia: NEGATIVE
Comment: NEGATIVE
Comment: NORMAL
Neisseria Gonorrhea: NEGATIVE

## 2021-10-03 ENCOUNTER — Ambulatory Visit: Payer: Medicaid Other | Admitting: Family Medicine

## 2021-10-10 ENCOUNTER — Encounter (HOSPITAL_BASED_OUTPATIENT_CLINIC_OR_DEPARTMENT_OTHER): Payer: Self-pay

## 2021-10-10 ENCOUNTER — Other Ambulatory Visit: Payer: Self-pay

## 2021-10-10 ENCOUNTER — Emergency Department (HOSPITAL_BASED_OUTPATIENT_CLINIC_OR_DEPARTMENT_OTHER)
Admission: EM | Admit: 2021-10-10 | Discharge: 2021-10-10 | Disposition: A | Discharge status: Home / Self Care | Payer: Medicaid Other | Attending: Emergency Medicine | Admitting: Emergency Medicine

## 2021-10-10 DIAGNOSIS — Z20822 Contact with and (suspected) exposure to covid-19: Secondary | ICD-10-CM | POA: Insufficient documentation

## 2021-10-10 DIAGNOSIS — R42 Dizziness and giddiness: Secondary | ICD-10-CM | POA: Insufficient documentation

## 2021-10-10 DIAGNOSIS — R519 Headache, unspecified: Secondary | ICD-10-CM | POA: Insufficient documentation

## 2021-10-10 LAB — COMPREHENSIVE METABOLIC PANEL
ALT: 8 U/L (ref 0–44)
AST: 14 U/L — ABNORMAL LOW (ref 15–41)
Albumin: 3.9 g/dL (ref 3.5–5.0)
Alkaline Phosphatase: 48 U/L (ref 38–126)
Anion gap: 6 (ref 5–15)
BUN: 9 mg/dL (ref 6–20)
CO2: 23 mmol/L (ref 22–32)
Calcium: 8.7 mg/dL — ABNORMAL LOW (ref 8.9–10.3)
Chloride: 107 mmol/L (ref 98–111)
Creatinine, Ser: 0.62 mg/dL (ref 0.44–1.00)
GFR, Estimated: >60 mL/min (ref >60)
Glucose, Bld: 100 mg/dL — ABNORMAL HIGH (ref 70–99)
Potassium: 3.8 mmol/L (ref 3.5–5.1)
Sodium: 136 mmol/L (ref 135–145)
Total Bilirubin: 0.9 mg/dL (ref 0.3–1.2)
Total Protein: 6.9 g/dL (ref 6.5–8.1)

## 2021-10-10 LAB — URINALYSIS, ROUTINE W REFLEX MICROSCOPIC
Bilirubin Urine: NEGATIVE
Glucose, UA: NEGATIVE mg/dL
Hgb urine dipstick: NEGATIVE
Ketones, ur: NEGATIVE mg/dL
Leukocytes,Ua: NEGATIVE
Nitrite: NEGATIVE
Protein, ur: NEGATIVE mg/dL
Specific Gravity, Urine: 1.025 (ref 1.005–1.030)
pH: 7 (ref 5.0–8.0)

## 2021-10-10 LAB — PREGNANCY, URINE: Preg Test, Ur: NEGATIVE

## 2021-10-10 LAB — CBC WITH DIFFERENTIAL/PLATELET
Abs Immature Granulocytes: 0.01 10*3/uL (ref 0.00–0.07)
Basophils Absolute: 0 10*3/uL (ref 0.0–0.1)
Basophils Relative: 0 %
Eosinophils Absolute: 0.1 10*3/uL (ref 0.0–0.5)
Eosinophils Relative: 2 %
HCT: 34.2 % — ABNORMAL LOW (ref 36.0–46.0)
Hemoglobin: 11 g/dL — ABNORMAL LOW (ref 12.0–15.0)
Immature Granulocytes: 0 %
Lymphocytes Relative: 42 %
Lymphs Abs: 2.5 10*3/uL (ref 0.7–4.0)
MCH: 27.8 pg (ref 26.0–34.0)
MCHC: 32.2 g/dL (ref 30.0–36.0)
MCV: 86.4 fL (ref 80.0–100.0)
Monocytes Absolute: 0.7 10*3/uL (ref 0.1–1.0)
Monocytes Relative: 11 %
Neutro Abs: 2.7 10*3/uL (ref 1.7–7.7)
Neutrophils Relative %: 45 %
Platelets: 251 10*3/uL (ref 150–400)
RBC: 3.96 MIL/uL (ref 3.87–5.11)
RDW: 15.7 % — ABNORMAL HIGH (ref 11.5–15.5)
WBC: 6 10*3/uL (ref 4.0–10.5)
nRBC: 0 % (ref 0.0–0.2)

## 2021-10-10 LAB — RESP PANEL BY RT-PCR (FLU A&B, COVID) ARPGX2
Influenza A by PCR: NEGATIVE
Influenza B by PCR: NEGATIVE
SARS Coronavirus 2 by RT PCR: NEGATIVE

## 2021-10-10 NOTE — ED Triage Notes (Signed)
Pt presents to ED from home C/O dizziness, lightheadedness, headache since last night. Pt denies fevers, chills. Pt reports recent participation in medical study for COVID/RSV medications.

## 2021-10-10 NOTE — ED Provider Notes (Signed)
[facility] EMERGENCY DEPARTMENT Provider Note   CSN: [id number] Arrival date & time: 10/10/21  8657     History Chief Complaint  Patient presents with   Dizziness    Tammy Sanchez is a 25 y.o. female.  Patient with onset of dizziness lightheadedness and a headache starting last evening.  No fevers no upper respiratory symptoms no chills.  No abdominal pain no nausea vomiting or diarrhea.  No cough congestion or sore throat.  She reports being involved in a study in [place] for a new type of oral COVID RSV medication for folks with diabetes.  She completed the medications on November 17.  She is still participating in the study.  Particulars of study not known to [place].  Shin instructed to kind and notify the study members.  She has a ability to make a phone call to them.  Patient denies any room spinning.      Past Medical History:  Diagnosis Date   Bacterial vaginosis     Patient Active Problem List   Diagnosis Date Noted   PID (acute pelvic inflammatory disease) 09/12/2017    History reviewed. No pertinent surgical history.   OB History     Gravida  0   Para  0   Term  0   Preterm  0   AB  0   Living  0      SAB  0   IAB  0   Ectopic  0   Multiple  0   Live Births  0           Family History  Problem Relation Age of Onset   Diabetes Paternal Grandmother     Social History   Tobacco Use   Smoking status: Never   Smokeless tobacco: Never  Vaping Use   Vaping Use: Never used  Substance Use Topics   Alcohol use: No   Drug use: No    Home Medications Prior to Admission medications   Medication Sig Start Date End Date Taking? Authorizing Provider  diclofenac (VOLTAREN) 75 MG EC tablet Take 1 tablet (75 mg total) by mouth 2 (two) times daily with a meal. Patient not taking: Reported on 11/29/2017 08/08/17   Leftwich-Kirby, Wilmer Floor, CNM  doxycycline (VIBRAMYCIN) 100 MG capsule Take 1 capsule (100 mg total) by mouth 2  (two) times daily. Patient not taking: Reported on 08/25/2018 11/29/17   Derwood Kaplan, MD  ibuprofen (ADVIL,MOTRIN) 200 MG tablet Take 200 mg by mouth every 6 (six) hours as needed for headache (pain).    [provider]  nystatin ointment (MYCOSTATIN) Apply 1 application topically 2 (two) times daily. Patient not taking: Reported on 11/29/2017 08/18/17   Sharen Counter A, CNM  triamcinolone ointment (KENALOG) 0.5 % Apply 1 application topically 2 (two) times daily. Patient not taking: Reported on 11/29/2017 08/18/17   Hurshel Party, CNM    Allergies    Patient has no known allergies.  Review of Systems   Review of Systems  Constitutional:  Negative for chills and fever.  HENT:  Negative for ear pain and sore throat.   Eyes:  Negative for pain and visual disturbance.  Respiratory:  Negative for cough and shortness of breath.   Cardiovascular:  Negative for chest pain and palpitations.  Gastrointestinal:  Negative for abdominal pain and vomiting.  Genitourinary:  Negative for dysuria and hematuria.  Musculoskeletal:  Negative for arthralgias and back pain.  Skin:  Negative for color change and  rash.  Neurological:  Positive for dizziness, light-headedness and headaches. Negative for seizures and syncope.  All other systems reviewed and are negative.  Physical Exam Updated Vital Signs BP 136/68 (BP Location: Right Arm)   Pulse 87   Temp 98.4 F (36.9 C) (Oral)   Resp 16   Ht 1.549 m (5\' 1" )   Wt 49.4 kg   LMP 10/09/2021   SpO2 100%   BMI 20.60 kg/m   Physical Exam Vitals and nursing note reviewed.  Constitutional:      General: She is not in acute distress.    Appearance: Normal appearance. She is well-developed.  HENT:     Head: Normocephalic and atraumatic.     Mouth/Throat:     Pharynx: Oropharynx is clear.  Eyes:     Extraocular Movements: Extraocular movements intact.     Conjunctiva/sclera: Conjunctivae normal.     Pupils: Pupils are equal,  round, and reactive to light.  Cardiovascular:     Rate and Rhythm: Normal rate and regular rhythm.     Heart sounds: No murmur heard. Pulmonary:     Effort: Pulmonary effort is normal. No respiratory distress.     Breath sounds: Normal breath sounds. No wheezing, rhonchi or rales.  Abdominal:     Palpations: Abdomen is soft.     Tenderness: There is no abdominal tenderness.  Musculoskeletal:        General: No swelling.     Cervical back: Normal range of motion and neck supple.  Skin:    General: Skin is warm and dry.     Capillary Refill: Capillary refill takes less than 2 seconds.  Neurological:     General: No focal deficit present.     Mental Status: She is alert and oriented to person, place, and time.     Cranial Nerves: No cranial nerve deficit.     Sensory: No sensory deficit.  Psychiatric:        Mood and Affect: Mood normal.    ED Results / Procedures / Treatments   Labs (all labs ordered are listed, but only abnormal results are displayed) Labs Reviewed  RESP PANEL BY RT-PCR (FLU A&B, COVID) ARPGX2  URINALYSIS, ROUTINE W REFLEX MICROSCOPIC  CBC WITH DIFFERENTIAL/PLATELET  COMPREHENSIVE METABOLIC PANEL  PREGNANCY, URINE    EKG None  Radiology No results found.  Procedures Procedures   Medications Ordered in ED Medications - No data to display  ED Course  I have reviewed the triage vital signs and the nursing notes.  Pertinent labs & imaging results that were available during my care of the patient were reviewed by me and considered in my medical decision making (see chart for details).    MDM Rules/Calculators/A&P                           Since patient involved in the study.  We will go ahead and COVID test here.  Do CBC complete metabolic panel urinalysis and pregnancy test.  Patient did talk about that people are having potential problem with some creatinine issues.  But again we do not know the specifics of the study parameters.  Patient will  notify the study supervisors.  Labs without any acute findings.  Patient did notify her study supervisors.  They are aware that she was seen.  COVID testing flu testing negative.  Symptoms could be the early onset of a viral illness or viral upper respiratory infection.  Patient nontoxic  no acute distress.  Stable for discharge home.   Final Clinical Impression(s) / ED Diagnoses Final diagnoses:  Lightheadedness    Rx / DC Orders ED Discharge Orders     None        Fredia Sorrow, MD 10/10/21 1026

## 2021-10-10 NOTE — Discharge Instructions (Signed)
Work-up without any lab abnormalities and COVID and flu testing was negative.  Return for any new or worse symptoms.  May be the beginning of a viral upper respiratory infection.

## 2021-10-10 NOTE — ED Notes (Signed)
D/c paperwork reviewed with pt.  Pt with no questions or concerns at time of d/c. Ambulatory to exit with steady gait, NAD.

## 2021-10-12 ENCOUNTER — Other Ambulatory Visit: Payer: Self-pay

## 2021-10-12 ENCOUNTER — Emergency Department (HOSPITAL_BASED_OUTPATIENT_CLINIC_OR_DEPARTMENT_OTHER): Payer: Medicaid Other

## 2021-10-12 ENCOUNTER — Emergency Department (HOSPITAL_BASED_OUTPATIENT_CLINIC_OR_DEPARTMENT_OTHER)
Admission: EM | Admit: 2021-10-12 | Discharge: 2021-10-13 | Disposition: A | Discharge status: Home / Self Care | Payer: Medicaid Other | Attending: Emergency Medicine | Admitting: Emergency Medicine

## 2021-10-12 ENCOUNTER — Encounter (HOSPITAL_BASED_OUTPATIENT_CLINIC_OR_DEPARTMENT_OTHER): Payer: Self-pay | Admitting: Emergency Medicine

## 2021-10-12 DIAGNOSIS — R519 Headache, unspecified: Secondary | ICD-10-CM | POA: Insufficient documentation

## 2021-10-12 MED ORDER — METOCLOPRAMIDE HCL 5 MG/ML IJ SOLN: 10.0000 mg | Freq: Once | INTRAMUSCULAR | Status: DC

## 2021-10-12 MED ORDER — DIPHENHYDRAMINE HCL 50 MG/ML IJ SOLN: 25.0000 mg | Freq: Once | INTRAMUSCULAR | Status: DC

## 2021-10-12 MED ORDER — KETOROLAC TROMETHAMINE 30 MG/ML IJ SOLN: 30.0000 mg | Freq: Once | INTRAMUSCULAR | Status: DC

## 2021-10-12 MED ORDER — DEXAMETHASONE SODIUM PHOSPHATE 10 MG/ML IJ SOLN: 10.0000 mg | Freq: Once | INTRAMUSCULAR | Status: DC

## 2021-10-12 MED ORDER — SODIUM CHLORIDE 0.9 % IV BOLUS: 1000.0000 mL | Freq: Once | INTRAVENOUS | Status: DC

## 2021-10-12 NOTE — ED Triage Notes (Addendum)
Pt c/o recurrent headache. States having 8/10 headache without any associated symptoms. Denies dizziness/lightheaded. Was last seen 11/23 for same symptoms.

## 2021-10-12 NOTE — ED Provider Notes (Signed)
[facility] EMERGENCY DEPARTMENT Provider Note   CSN: [id number] Arrival date & time: 10/12/21  2217     History Chief Complaint  Patient presents with   Headache    Tammy Sanchez is a 25 y.o. female.  Patient is a 25 year old female with no significant past medical history.  She presents today for evaluation of headache.  This has been ongoing for the past several days.  Patient was seen here 2 days ago with similar complaints.  She had multiple laboratory studies as well as test for COVID and influenza performed.  These were all unremarkable.  Patient describes her headache as concentrated to the back of her head with no associated nausea, fever, stiff neck, blurry vision, or numbness.  She denies any injury or trauma.  She reports having no relief with Excedrin.  The history is provided by the patient.      Past Medical History:  Diagnosis Date   Bacterial vaginosis     Patient Active Problem List   Diagnosis Date Noted   PID (acute pelvic inflammatory disease) 09/12/2017    History reviewed. No pertinent surgical history.   OB History     Gravida  0   Para  0   Term  0   Preterm  0   AB  0   Living  0      SAB  0   IAB  0   Ectopic  0   Multiple  0   Live Births  0           Family History  Problem Relation Age of Onset   Diabetes Paternal Grandmother     Social History   Tobacco Use   Smoking status: Never   Smokeless tobacco: Never  Vaping Use   Vaping Use: Never used  Substance Use Topics   Alcohol use: No   Drug use: No    Home Medications Prior to Admission medications   Medication Sig Start Date End Date Taking? Authorizing Provider  diclofenac (VOLTAREN) 75 MG EC tablet Take 1 tablet (75 mg total) by mouth 2 (two) times daily with a meal. Patient not taking: Reported on 11/29/2017 08/08/17   Leftwich-Kirby, Wilmer Floor, CNM  doxycycline (VIBRAMYCIN) 100 MG capsule Take 1 capsule (100 mg total) by mouth 2 (two) times  daily. Patient not taking: Reported on 08/25/2018 11/29/17   Derwood Kaplan, MD  ibuprofen (ADVIL,MOTRIN) 200 MG tablet Take 200 mg by mouth every 6 (six) hours as needed for headache (pain).    [provider]  nystatin ointment (MYCOSTATIN) Apply 1 application topically 2 (two) times daily. Patient not taking: Reported on 11/29/2017 08/18/17   Sharen Counter A, CNM  triamcinolone ointment (KENALOG) 0.5 % Apply 1 application topically 2 (two) times daily. Patient not taking: Reported on 11/29/2017 08/18/17   Hurshel Party, CNM    Allergies    Patient has no known allergies.  Review of Systems   Review of Systems  All other systems reviewed and are negative.  Physical Exam Updated Vital Signs BP 128/76 (BP Location: Right Arm)   Pulse 74   Temp 98.8 F (37.1 C) (Oral)   Resp 18   Ht 5\' 1"  (1.549 m)   Wt 49.4 kg   LMP 10/12/2021   SpO2 100%   BMI 20.60 kg/m   Physical Exam Vitals and nursing note reviewed.  Constitutional:      General: She is not in acute distress.    Appearance: She  is well-developed. She is not diaphoretic.  HENT:     Head: Normocephalic and atraumatic.  Eyes:     General: No visual field deficit. Cardiovascular:     Rate and Rhythm: Normal rate and regular rhythm.     Heart sounds: No murmur heard.   No friction rub. No gallop.  Pulmonary:     Effort: Pulmonary effort is normal. No respiratory distress.     Breath sounds: Normal breath sounds. No wheezing.  Abdominal:     General: Bowel sounds are normal. There is no distension.     Palpations: Abdomen is soft.     Tenderness: There is no abdominal tenderness.  Musculoskeletal:        General: Normal range of motion.     Cervical back: Normal range of motion and neck supple.  Skin:    General: Skin is warm and dry.  Neurological:     General: No focal deficit present.     Mental Status: She is alert and oriented to person, place, and time.     Cranial Nerves: No cranial  nerve deficit, dysarthria or facial asymmetry.    ED Results / Procedures / Treatments   Labs (all labs ordered are listed, but only abnormal results are displayed) Labs Reviewed - No data to display  EKG None  Radiology No results found.  Procedures Procedures   Medications Ordered in ED Medications  ketorolac (TORADOL) 30 MG/ML injection 30 mg (has no administration in time range)  sodium chloride 0.9 % bolus 1,000 mL (has no administration in time range)  diphenhydrAMINE (BENADRYL) injection 25 mg (has no administration in time range)  dexamethasone (DECADRON) injection 10 mg (has no administration in time range)  metoCLOPramide (REGLAN) injection 10 mg (has no administration in time range)    ED Course  I have reviewed the triage vital signs and the nursing notes.  Pertinent labs & imaging results that were available during my care of the patient were reviewed by me and considered in my medical decision making (see chart for details).    MDM Rules/Calculators/A&P  Patient presenting here with complaints of headache as described in the HPI.  Initial plan was to administer a migraine cocktail, however I was informed by the nurse she was refusing this.  I returned to the room to discuss and patient informed me she was only here for reassurance that everything was okay.  At that point, we agreed to perform a CT scan.  I then left the room.  Several minutes later, I was informed she was refusing her CT scan.  Patient presenting here with headache, but wants no medications and no test done.  I am uncertain as to the exact reason for her presentation, but vitals are stable and she is neurologically intact.  Patient to be discharged with as needed return.  Final Clinical Impression(s) / ED Diagnoses Final diagnoses:  None    Rx / DC Orders ED Discharge Orders     None        Geoffery Lyons, MD 10/13/21 0000

## 2021-10-12 NOTE — ED Notes (Signed)
Patient refused CT scan of the head. She stated CT is optional and she didn't wanted. Charge Nurse made aware.

## 2021-10-13 NOTE — ED Notes (Signed)
Pt. Very pleased with her care and decided she wanted to go home with just piece of mind after speaking with the EDP.

## 2021-10-13 NOTE — Discharge Instructions (Signed)
Take Tylenol 650 mg rotated with ibuprofen 400 mg every 4 hours as needed for pain.  Return to the ER if symptoms significantly worsen or change.

## 2021-10-13 NOTE — ED Notes (Signed)
Pt. Reports she has a headache in the back of her head and is concerned due to she was in a clinical trial of meds and wants to make sure she is ok.  Pt. Has no visual disturbance and no trouble speaking and is alert and oriented.

## 2022-05-23 IMAGING — US US PELVIS COMPLETE TRANSABD/TRANSVAG W DUPLEX
1 series · 13 of 25 positions shown · non-contrast
Comparison: Pelvic ultrasound 12/03/2018

CLINICAL DATA: Pelvic pain for 1 year

EXAM:
TRANSABDOMINAL AND TRANSVAGINAL ULTRASOUND OF PELVIS
DOPPLER ULTRASOUND OF OVARIES
TECHNIQUE: Both transabdominal and transvaginal ultrasound examinations of the
pelvis were performed. Transabdominal technique was performed for
global imaging of the pelvis including uterus, ovaries, adnexal
regions, and pelvic cul-de-sac.
It was necessary to proceed with endovaginal exam following the
transabdominal exam to visualize the uterus, endometrium, and
ovaries. Color and duplex Doppler ultrasound was utilized to
evaluate blood flow to the ovaries.

[Series 1: us pelvis complete transabd/transvag w duplex · 13 of 114 slices shown]
[im 1/114]
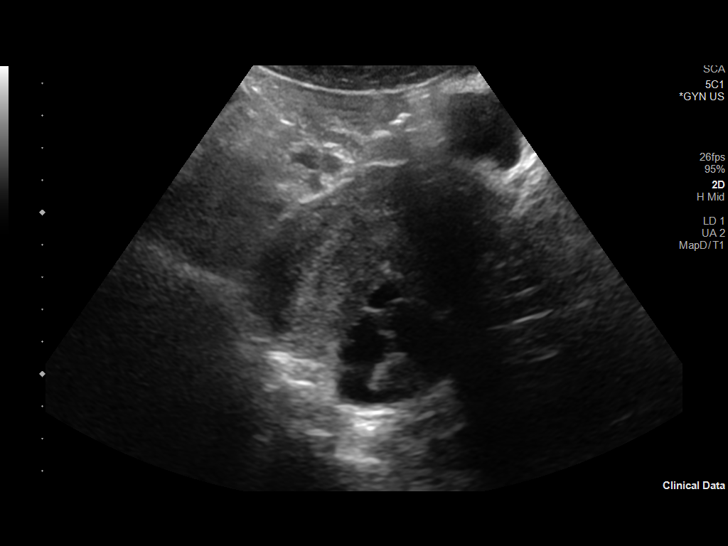
[im 10/114]
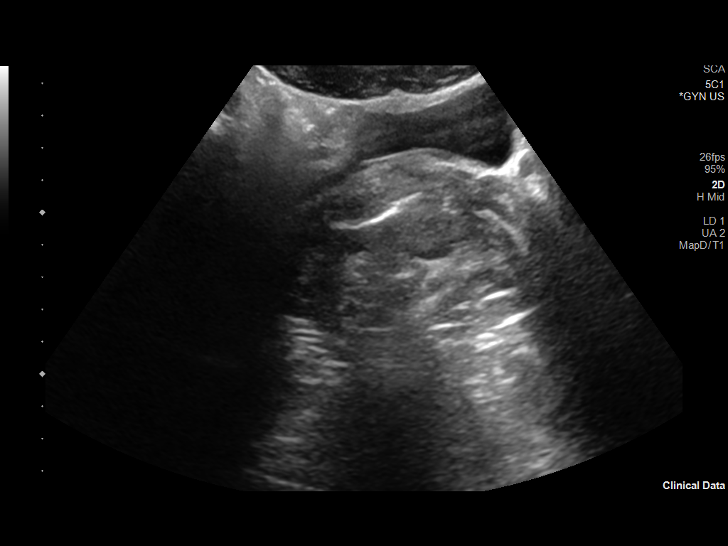
[im 19/114]
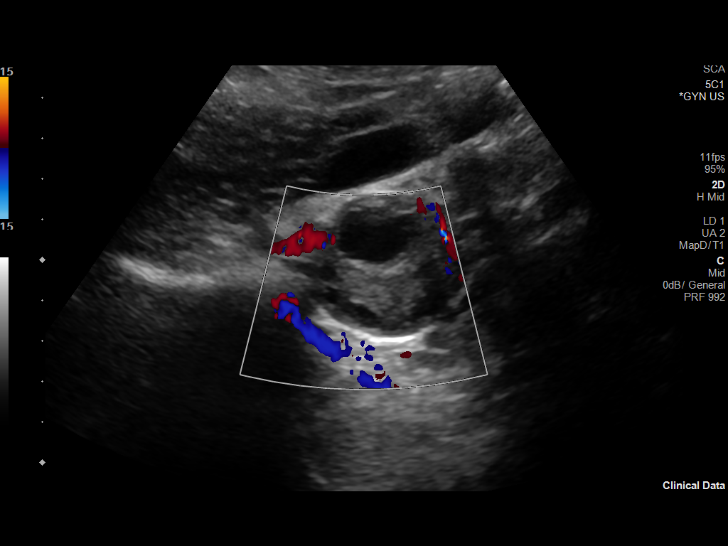
[im 29/114]
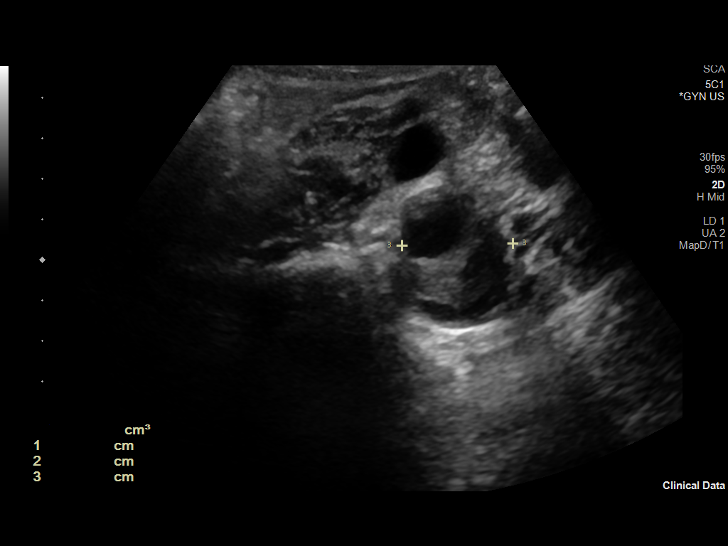
[im 38/114]
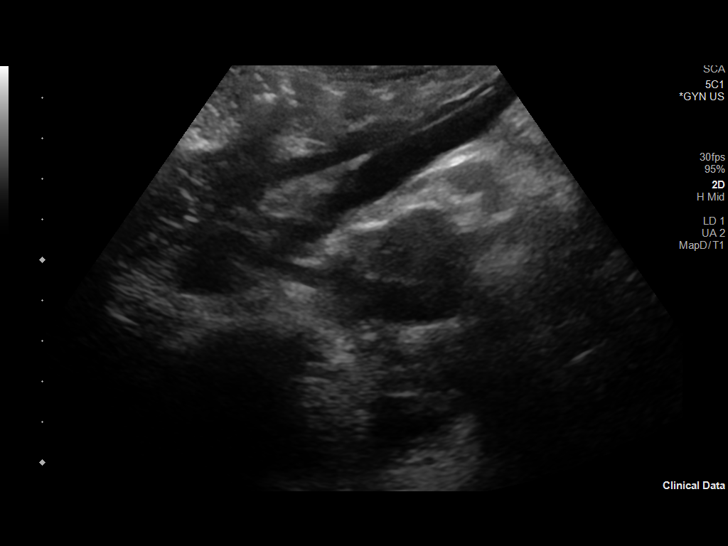
[im 48/114]
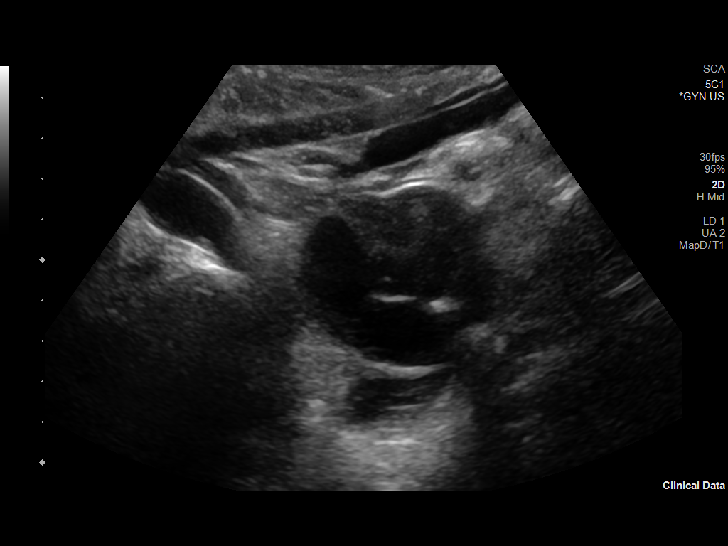
[im 57/114]
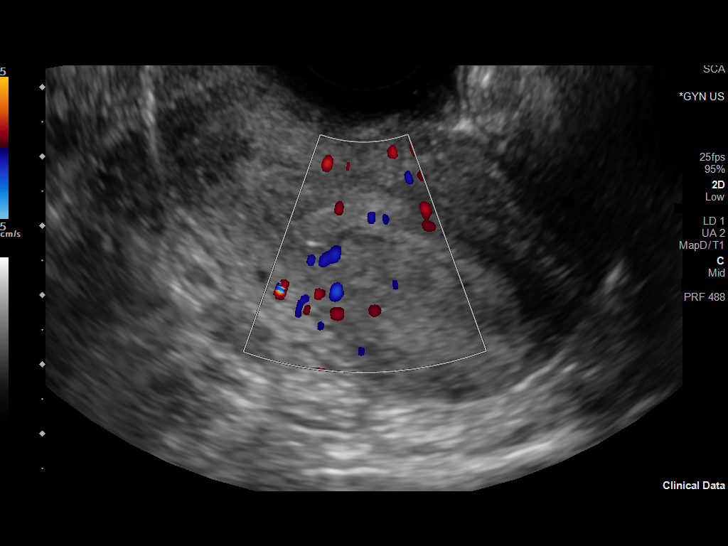
[im 66/114]
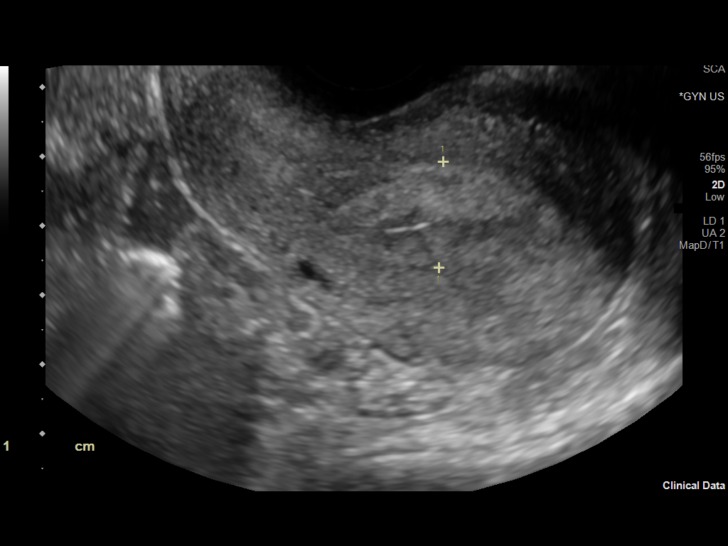
[im 76/114]
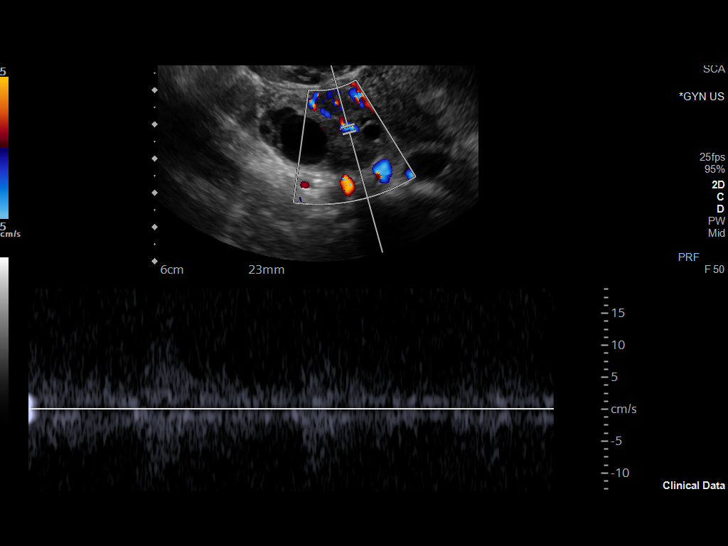
[im 85/114]
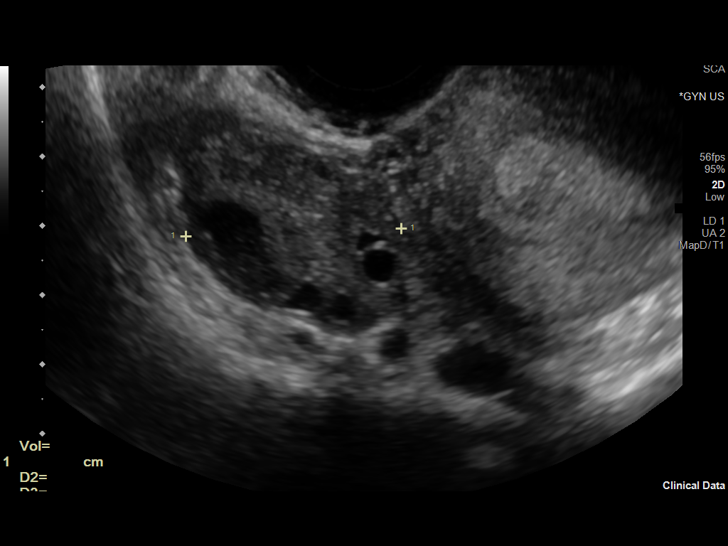
[im 95/114]
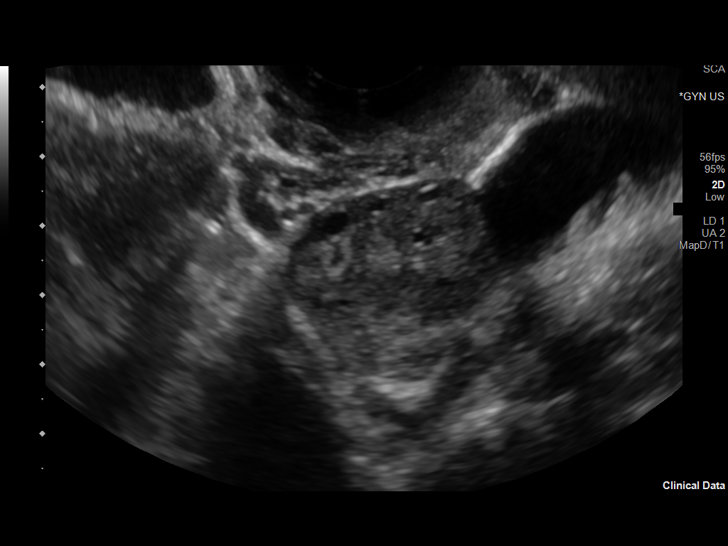
[im 104/114]
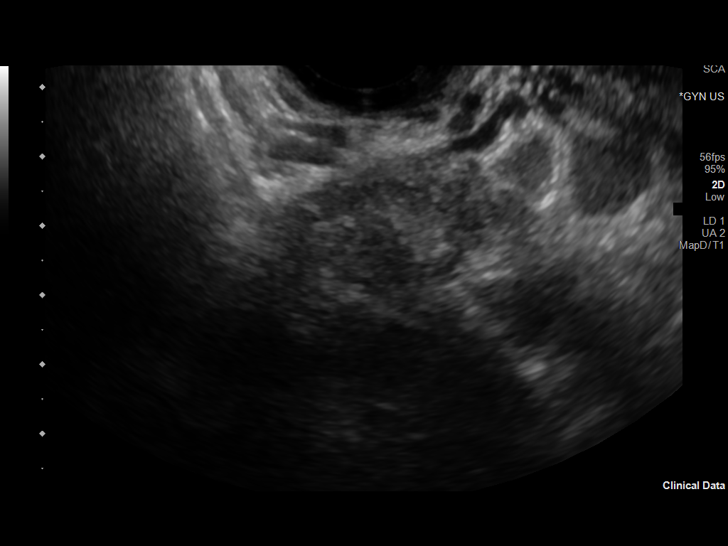
[im 114/114]
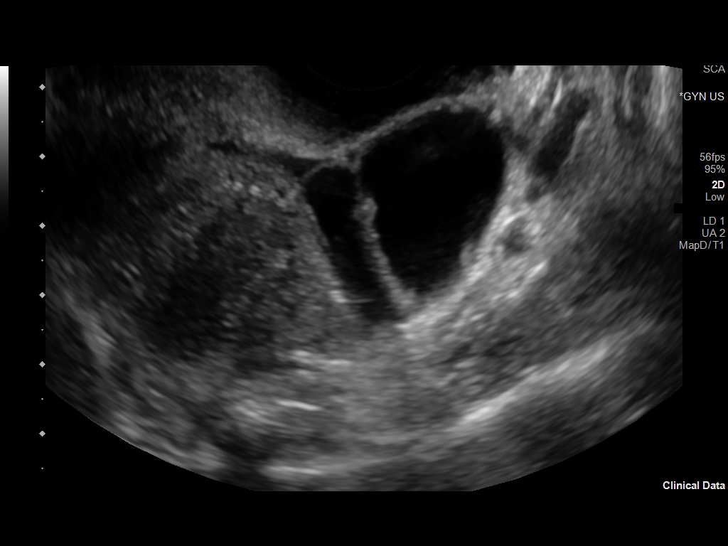

[13 of 25 positions shown; findings below may reference images not displayed]

FINDINGS: Uterus

Measurements: 7.6 cm x 3.9 cm x 4.2 cm = volume: 65 mL. No fibroids
or other mass visualized.

Endometrium

Thickness: 15 mm.  No focal abnormality visualized.

Right ovary

Measurements: 3.9 cm x 2.6 cm x 3.1 cm = volume: 16.2 mL. Normal
appearance/no adnexal mass.

Left ovary

Measurements: 3.3 cm x 2.5 cm x 3.4 cm = volume: 14.4 mL. The left
ovary is normal in appearance. There is a tubular structure in the
left adnexa measuring up to 4.9 cm x 3.0 cm on the current study
likely reflecting hydrosalpinx, also present on the prior study from
12/03/2018.

Pulsed Doppler evaluation of both ovaries demonstrates normal
low-resistance arterial and venous waveforms.

Other findings

No abnormal free fluid.
IMPRESSION: 1. Left hydrosalpinx, also present on the prior study from
12/03/2018.
2. Otherwise, unremarkable pelvic ultrasound.

## 2022-05-30 ENCOUNTER — Emergency Department: Admit: 2022-05-30 | Payer: MEDICAID

## 2022-05-30 ENCOUNTER — Inpatient Hospital Stay
Admit: 2022-05-30 | Discharge: 2022-05-30 | Disposition: A | Discharge status: Home / Self Care | Payer: MEDICAID | Attending: Emergency Medicine

## 2022-05-30 DIAGNOSIS — R1030 Lower abdominal pain, unspecified: Secondary | ICD-10-CM

## 2022-05-30 DIAGNOSIS — R1031 Right lower quadrant pain: Secondary | ICD-10-CM

## 2022-05-30 LAB — COMPREHENSIVE METABOLIC PANEL
ALT: 14 U/L (ref 13–56)
AST: 10 U/L (ref 10–38)
Albumin/Globulin Ratio: 1.2 (ref 0.8–1.7)
Albumin: 3.6 g/dL (ref 3.4–5.0)
Alk Phosphatase: 84 U/L (ref 45–117)
Anion Gap: 4 mmol/L (ref 3.0–18)
BUN/Creatinine Ratio: 12 (ref 12–20)
BUN: 9 MG/DL (ref 7.0–18)
CO2: 25 mmol/L (ref 21–32)
Calcium: 8.7 MG/DL (ref 8.5–10.1)
Chloride: 116 mmol/L — ABNORMAL HIGH (ref 100–111)
Creatinine: 0.74 MG/DL (ref 0.6–1.3)
Est, Glom Filt Rate: >60 mL/min/{1.73_m2} (ref >60)
Globulin: 3.1 g/dL (ref 2.0–4.0)
Glucose: 103 mg/dL — ABNORMAL HIGH (ref 74–99)
Potassium: 3.6 mmol/L (ref 3.5–5.5)
Sodium: 145 mmol/L (ref 136–145)
Total Bilirubin: 0.6 MG/DL (ref 0.2–1.0)
Total Protein: 6.7 g/dL (ref 6.4–8.2)

## 2022-05-30 LAB — CBC WITH AUTO DIFFERENTIAL
Absolute Immature Granulocyte: 0 10*3/uL (ref 0.00–0.04)
Basophils %: 0 % (ref 0–2)
Basophils Absolute: 0 10*3/uL (ref 0.0–0.1)
Eosinophils %: 1 % (ref 0–5)
Eosinophils Absolute: 0.1 10*3/uL (ref 0.0–0.4)
Hematocrit: 34.2 % — ABNORMAL LOW (ref 35.0–45.0)
Hemoglobin: 10.9 g/dL — ABNORMAL LOW (ref 12.0–16.0)
Immature Granulocytes: 0 % (ref 0.0–0.5)
Lymphocytes %: 51 % (ref 21–52)
Lymphocytes Absolute: 3.4 10*3/uL (ref 0.9–3.6)
MCH: 27.7 PG (ref 24.0–34.0)
MCHC: 31.9 g/dL (ref 31.0–37.0)
MCV: 86.8 FL (ref 78.0–100.0)
MPV: 9.9 FL (ref 9.2–11.8)
Monocytes %: 10 % (ref 3–10)
Monocytes Absolute: 0.7 10*3/uL (ref 0.05–1.2)
Neutrophils %: 37 % — ABNORMAL LOW (ref 40–73)
Neutrophils Absolute: 2.4 10*3/uL (ref 1.8–8.0)
Nucleated RBCs: 0 PER 100 WBC
Platelets: 296 10*3/uL (ref 135–420)
RBC: 3.94 M/uL — ABNORMAL LOW (ref 4.20–5.30)
RDW: 14.4 % (ref 11.6–14.5)
WBC: 6.6 10*3/uL (ref 4.6–13.2)
nRBC: 0 10*3/uL (ref 0.00–0.01)

## 2022-05-30 LAB — URINALYSIS
Bilirubin Urine: NEGATIVE
Glucose, UA: NEGATIVE mg/dL
Leukocyte Esterase, Urine: NEGATIVE
Nitrite, Urine: NEGATIVE
Specific Gravity, UA: >1.03 — ABNORMAL HIGH (ref 1.003–1.030)
Urobilinogen, Urine: 1 EU/dL (ref 0.2–1.0)
pH, Urine: 6 (ref 5.0–8.0)

## 2022-05-30 LAB — URINALYSIS, MICRO: WBC, UA: 0 /hpf (ref 0–5)

## 2022-05-30 LAB — C.TRACHOMATIS N.GONORRHOEAE DNA
Chlamydia trachomatis, NAA: NEGATIVE
Neisseria Gonorrhoeae, NAA: NEGATIVE

## 2022-05-30 LAB — MAGNESIUM: Magnesium: 2.2 mg/dL (ref 1.6–2.6)

## 2022-05-30 LAB — PREGNANCY, URINE: HCG(Urine) Pregnancy Test: NEGATIVE

## 2022-05-30 LAB — LIPASE: Lipase: 91 U/L (ref 73–393)

## 2022-05-30 NOTE — ED Provider Notes (Signed)
[facility] EMERGENCY DEPT  EMERGENCY DEPARTMENT ENCOUNTER    Patient Name: Tina Hendricks  MRN: [id number]  Birth Date: [birth year]  Provider: Teodoro Kil, MD  PCP: No primary care provider on file.   Time/Date of evaluation: 4:28 AM EDT on 05/30/22    History of Presenting Illness     History Provided by: Patient  History is limited by: Nothing     HISTORY:   Tina Hendricks is a 26 y.o. female senting with about a 1 week history of right lower quadrant abdominal pain which she Describes as sharp intermittent sometimes cramping in nature.  No nausea vomiting diarrhea constipation or bloody or black tarry stools.  No urinary symptoms.  She has had some intermittent vaginal bleeding.  Currently on birth control.  Concerned about this right lower quadrant pain.    Patient also expressing concerns about no place to stay.  She was living with her boyfriend until tonight.  She says that her boyfriend has his name on the lease and that she will be traveling back to [place] on Friday but has no place to stay until then.    Nursing Notes were all reviewed and agreed with or any disagreements were addressed in the HPI.    Past History     PAST MEDICAL HISTORY:  History reviewed. No pertinent past medical history.    PAST SURGICAL HISTORY:  History reviewed. No pertinent surgical history.    FAMILY HISTORY:  History reviewed. No pertinent family history.    SOCIAL HISTORY:       MEDICATIONS:  No current facility-administered medications for this encounter.     No current outpatient medications on file.       ALLERGIES:  No Known Allergies    SOCIAL DETERMINANTS OF HEALTH:  Social Determinants of Health     Tobacco Use: Not on file   Alcohol Use: Not on file   Financial Resource Strain: Not on file   Food Insecurity: Not on file   Transportation Needs: Not on file   Physical Activity: Not on file   Stress: Not on file   Social Connections: Not on file   Intimate Partner Violence: Not on file   Depression: Not on file   Housing  Stability: Not on file       Review of Systems     Negative except as listed above in HPI.    Physical Exam     Vitals:    05/30/22 0345   BP: 129/71   Pulse: 84   Resp: 16   Temp: 98.8 F (37.1 C)   SpO2: 100%   Weight: 115 lb (52.2 kg)   Height: 5\' 1"  (1.549 m)       Constitutional: Well nourished, well developed, appears stated age. Alert and oriented.  HEENT: Neck supple without meningismus. PERRLA, no conjunctival injection. EOM intact  Cardiovascular: RRR, Warm, well-perfused extremities  Respiratory: CTAB, Unlabored respiratory effort  Abdominal: Soft, nontender, nondistended, no CVA tenderness  Musculoskeletal: Full range of motion all extremities. No deformities. No peripheral edema.  Skin: Warm, dry. No rashes  Vascular: Pulses equal in all 4 extremities.  Neuro: CNs II-XII grossly intact. Sensation and motor function intact.  Psych: Appropriate mood and affect.    Diagnostic Study Results     LABS:  No results found for this or any previous visit (from the past 12 hour(s)).    RADIOLOGIC STUDIES:   Non x-ray images such as CT, Ultrasound and MRI are read by the  radiologist. X-ray images are visualized and preliminarily interpreted by the ED Provider with the findings as listed in the ED Course section below.     Interpretation per the Radiologist is listed below, if available at the time of this note:    US TRANSVAGINAL W/ DOPPLER    (Results Pending)       Procedures     Procedures    ED Course and Medical Decision Making     4:28 AM EDT I Kerney Elbe, MD) am the first provider for this patient. Initial assessment performed. I reviewed the vital signs, available nursing notes, past medical history, past surgical history, family history and social history. The patients presenting problems have been discussed, and they are in agreement with the care plan formulated and outlined with them. I have encouraged them to ask questions as they arise throughout their visit.    Vitals were stable on  arrival.  My differential diagnosis included but was not limited to intra-abdominal infection.    Labs were all within normal is.  Patient is awaiting ultrasound of the right lower quadrant.    RECORDS REVIEWED: Nursing Notes    Is this patient to be included in the SEP-1 core measure? No Exclusion criteria - the patient is NOT to be included for SEP-1 Core Measure due to: Infection is not suspected    MEDICATIONS ADMINISTERED IN THE ED:  Medications - No data to display    ED Course as of 05/30/22 2311   Thu May 30, 2022   [id number] US shows:  1.  Anechoic thick-walled tubular focus in the left adnexa, nonspecific, may  reflect hydrosalpinx versus fluid-filled bowel loop.  2.  Unremarkable uterus and ovaries. [RK]      ED Course User Index  [RK] Agustina Caroli, DO       None    Critical Care: None    Diagnosis and Disposition   The patient was signed out to the oncoming physician, Dr. [place]ble [place], pending Ultrasound.      Clinical impression:   1. Lower abdominal pain      Disposition: Pending     Dragon Disclaimer     Please note that this dictation was completed with Dragon, the computer voice recognition software. Quite often unanticipated grammatical, syntax, homophones, and other interpretive errors are inadvertently transcribed by the computer software. Please disregard these errors. Please excuse any errors that have escaped final proofreading.      I Kerney Elbe, MD am the primary clinician of record.  Kerney Elbe, MD  (Electronically signed)            Teodoro Kil, MD  05/30/22 2312

## 2022-05-30 NOTE — ED Notes (Signed)
Patient bed resting.. states she is comfortable for now. States she is aware of waiting on results.  Given extra blankets no voiced c/o     Sherilyn Dacosta, RN  05/30/22 [phone]

## 2022-05-30 NOTE — ED Notes (Signed)
Pt presents c/o constant lower abdominal pain x 1 week. Pt denies any N/V/D.      Raford Pitcher, RN  05/30/22 [phone]

## 2022-05-30 NOTE — Discharge Instructions (Signed)
Follow up with your primary care doctor or your ob/gyn if you have one to discuss your ultrasound results, especially if symptoms continue. We will call if your STI testing is positive. Can take tylenol every 6 hours for pai

## 2022-05-30 NOTE — ED Provider Notes (Signed)
EMERGENCY DEPARTMENT CONTINUING CARE NOTE      THIS NOTE IS DOCUMENTATION OF CARE PROVIDED AFTER CARE OF THE PATIENT WAS TRANSFERRED TO ME. PLEASE SEE THE NOTE BY THE INITIAL ED PROVIDER FOR DETAILS SURROUNDING THE H&P AND INITIAL MEDICAL DECISION MAKING.    Patient Name: Tina Hendricks  MRN: [id number]  Birth Date: 05/28/1996  Provider: Vertis Kelch, DO  PCP: No primary care provider on file.   Date of transfer of care: 05/30/22    Diagnostic Study Results     Labs -     Recent Results (from the past 12 hour(s))   CBC with Auto Differential    Collection Time: 05/30/22  4:35 AM   Result Value Ref Range    WBC 6.6 4.6 - 13.2 K/uL    RBC 3.94 (L) 4.20 - 5.30 M/uL    Hemoglobin 10.9 (L) 12.0 - 16.0 g/dL    Hematocrit 34.2 (L) 35.0 - 45.0 %    MCV 86.8 78.0 - 100.0 FL    MCH 27.7 24.0 - 34.0 PG    MCHC 31.9 31.0 - 37.0 g/dL    RDW 14.4 11.6 - 14.5 %    Platelets 296 135 - 420 K/uL    MPV 9.9 9.2 - 11.8 FL    Nucleated RBCs 0.0 0 PER 100 WBC    nRBC 0.00 0.00 - 0.01 K/uL    Neutrophils % 37 (L) 40 - 73 %    Lymphocytes % 51 21 - 52 %    Monocytes % 10 3 - 10 %    Eosinophils % 1 0 - 5 %    Basophils % 0 0 - 2 %    Immature Granulocytes 0 0.0 - 0.5 %    Neutrophils Absolute 2.4 1.8 - 8.0 K/UL    Lymphocytes Absolute 3.4 0.9 - 3.6 K/UL    Monocytes Absolute 0.7 0.05 - 1.2 K/UL    Eosinophils Absolute 0.1 0.0 - 0.4 K/UL    Basophils Absolute 0.0 0.0 - 0.1 K/UL    Absolute Immature Granulocyte 0.0 0.00 - 0.04 K/UL    Differential Type AUTOMATED     Comprehensive Metabolic Panel    Collection Time: 05/30/22  4:35 AM   Result Value Ref Range    Sodium 145 136 - 145 mmol/L    Potassium 3.6 3.5 - 5.5 mmol/L    Chloride 116 (H) 100 - 111 mmol/L    CO2 25 21 - 32 mmol/L    Anion Gap 4 3.0 - 18 mmol/L    Glucose 103 (H) 74 - 99 mg/dL    BUN 9 7.0 - 18 MG/DL    Creatinine 0.74 0.6 - 1.3 MG/DL    Bun/Cre Ratio 12 12 - 20      Est, Glom Filt Rate >60 >60 ml/min/1.66m    Calcium 8.7 8.5 - 10.1 MG/DL    Total Bilirubin 0.6 0.2 - 1.0  MG/DL    ALT 14 13 - 56 U/L    AST 10 10 - 38 U/L    Alk Phosphatase 84 45 - 117 U/L    Total Protein 6.7 6.4 - 8.2 g/dL    Albumin 3.6 3.4 - 5.0 g/dL    Globulin 3.1 2.0 - 4.0 g/dL    Albumin/Globulin Ratio 1.2 0.8 - 1.7     Magnesium    Collection Time: 05/30/22  4:35 AM   Result Value Ref Range    Magnesium 2.2 1.6 - 2.6 mg/dL   Lipase  Collection Time: 05/30/22  4:35 AM   Result Value Ref Range    Lipase 91 73 - 393 U/L   Urinalysis    Collection Time: 05/30/22  5:55 AM   Result Value Ref Range    Color, UA YELLOW      Appearance CLEAR      Specific Gravity, UA >1.030 (H) 1.003 - 1.030    pH, Urine 6.0 5.0 - 8.0      Protein, UA TRACE (A) NEG mg/dL    Glucose, UA Negative NEG mg/dL    Ketones, Urine TRACE (A) NEG mg/dL    Bilirubin Urine Negative NEG      Blood, Urine LARGE (A) NEG      Urobilinogen, Urine 1.0 0.2 - 1.0 EU/dL    Nitrite, Urine Negative NEG      Leukocyte Esterase, Urine Negative NEG     Pregnancy, Urine    Collection Time: 05/30/22  5:55 AM   Result Value Ref Range    HCG(Urine) Pregnancy Test Negative NEG     Urinalysis, Micro    Collection Time: 05/30/22  5:55 AM   Result Value Ref Range    WBC, UA 0 to 3 0 - 5 /hpf    RBC, UA TOO NUMEROUS TO COUNT 0 - 5 /hpf    Epithelial Cells UA 1+ 0 - 5 /lpf    BACTERIA, URINE FEW (A) NEG /hpf       Radiologic Studies -   US TRANSVAGINAL W/ DOPPLER   Final Result   1.  Anechoic thick-walled tubular focus in the left adnexa, nonspecific, may   reflect hydrosalpinx versus fluid-filled bowel loop.   2.  Unremarkable uterus and ovaries.                ED Course     TRANSFER OF CARE:  8:51 AM EDT  Patient care will be transferred from Dr. Lovena Le to Vertis Kelch DO. Discussed available diagnostic results and care plan at length at beside. Patient and family made aware of provider change. All questions answered. Patient and family voiced understanding.    MEDICATIONS ADMINISTERED IN THE ED:  Medications - No data to display    ED Course as of 05/30/22 0857    Thu May 30, 2022   0837 US shows:  1.  Anechoic thick-walled tubular focus in the left adnexa, nonspecific, may  reflect hydrosalpinx versus fluid-filled bowel loop.  2.  Unremarkable uterus and ovaries. [RK]      ED Course User Index  [RK] Vertis Kelch, DO       Medical Decision Making     Discussion:  This appears to be a mild contion.   26 y.o. female who presented with lower abdominal pain without vomiting or change bowel movements. Workup thus far include reassuring cbc, cmp, and urinalysis. Requested testing for G/C/T but no empiric treatment at this time deemed appropriate given lack of sxs and risk/benefit. Patient is pending U/S of ovaries.     Re-eval [id number]  Patient is feeling better.  Patient's ultrasound was reassuring.  Patient was informed will be called if any positive results for gonorrhea, chlamydia or trichomonas.  Patient was advised to follow-up with primary care doctor to discuss ultrasound results.    Additional Considerations:  None    Critical Care Time:     None    Vertis Kelch, DO      Diagnosis and Disposition     Clinical Impression: Lower abdominal pain  Disposition:  Discharge home  New meds: none    Dragon Disclaimer     Please note that this dictation was completed with Dragon, the computer voice recognition software.  Quite often unanticipated grammatical, syntax, homophones, and other interpretive errors are inadvertently transcribed by the computer software.  Please disregard these errors.  Please excuse any errors that have escaped final proofreading.    Vertis Kelch, DO  (Electronically signed)          Vertis Kelch, DO  05/30/22 [phone]

## 2022-05-30 NOTE — ED Notes (Signed)
Telephone call from patients mother states she wants patient to call her. Patient made aware.      Sherilyn Dacosta, RN  05/30/22 [phone]

## 2022-05-30 NOTE — ED Notes (Signed)
Bedside and Verbal shift change report given to Barbie, Charity fundraiser ([employer]) by Carollee Herter, RN (offgoing nurse). Report included the following information Nurse Handoff Report, Index, ED Encounter Summary, MAR, and Recent Results.       Lawerance Cruel, RN  05/30/22 [phone]

## 2022-06-06 LAB — TRICHOMONAS VAGINALI, MOLECULAR: Trichomonas Vaginalis by NAA: NEGATIVE

## 2024-03-19 ENCOUNTER — Emergency Department (HOSPITAL_BASED_OUTPATIENT_CLINIC_OR_DEPARTMENT_OTHER)
Admission: EM | Admit: 2024-03-19 | Discharge: 2024-03-19 | Disposition: A | Discharge status: Home / Self Care | Payer: Self-pay | Attending: Emergency Medicine | Admitting: Emergency Medicine

## 2024-03-19 DIAGNOSIS — M79602 Pain in left arm: Secondary | ICD-10-CM | POA: Insufficient documentation

## 2024-03-19 NOTE — ED Provider Notes (Signed)
 Emergency Department Provider Note   I have reviewed the triage vital signs and the nursing notes.   HISTORY  Chief Complaint Arm Pain   HPI Tammy Sanchez is a 28 y.o. female with past history reviewed below presents to the emergency department for evaluation of small bump on the left proximal forearm.  No overlying skin changes.  The patient felt a small bump in this area for the past 3 weeks.  She has been monitoring at home with some intermittent pain shooting from the elbow into the hand.  She is currently not having any discomfort.  No fevers.  No IVDA. No joint pain. No injury.     Past Medical History:  Diagnosis Date   Bacterial vaginosis     Review of Systems  Constitutional: No fever/chills Musculoskeletal: Left arm pain with bump.  Skin: Negative for rash.  ____________________________________________   PHYSICAL EXAM:  VITAL SIGNS: ED Triage Vitals  Encounter Vitals Group     BP 03/19/24 1007 123/79     Pulse Rate 03/19/24 1007 81     Resp 03/19/24 1007 18     Temp 03/19/24 1007 100 F (37.8 C)     Temp Source 03/19/24 1007 Oral     SpO2 03/19/24 1007 97 %     Weight 03/19/24 1009 140 lb (63.5 kg)     Height 03/19/24 1009 5\' 1"  (1.549 m)   Constitutional: Alert and oriented. Well appearing and in no acute distress. Eyes: Conjunctivae are normal.  Head: Atraumatic. Nose: No congestion/rhinnorhea. Mouth/Throat: Mucous membranes are moist.  Neck: No stridor.   Cardiovascular: Good peripheral circulation. Respiratory: Normal respiratory effort.   Gastrointestinal: No distention.  Musculoskeletal: No joint effusion of the left elbow or wrist.  No point/bony tenderness.  Compartments are soft.  There is a punctate approximately 1/2 cm firm area to the proximal left forearm.  No overlying skin changes or rash.  No cordlike structure to suspect superficial thrombosis. Firm area is mobile.  Neurologic:  Normal speech and language.  Skin:  Skin is warm, dry  and intact. No rash noted.  ____________________________________________   PROCEDURES  Procedure(s) performed:   Procedures  ULTRASOUND LIMITED SOFT TISSUE/ MUSCULOSKELETAL: left forearm Indication: palpable mass Linear probe used to evaluate area of interest in two planes. Findings:  No abscess or cobblestone appearance to suspect cellulitis.  Performed by: Dr Dolan Freiberg Images saved electronically  ____________________________________________   INITIAL IMPRESSION / ASSESSMENT AND PLAN / ED COURSE  Pertinent labs & imaging results that were available during my care of the patient were reviewed by me and considered in my medical decision making (see chart for details).   This patient is Presenting for Evaluation of left arm pain, which does require a range of treatment options, and is a complaint that involves a moderate risk of morbidity and mortality.  The Differential Diagnoses include lipoma, cellulitis, abscess, etc  Medical Decision Making: Summary:  Patient presents emergency department with left arm pain.  There is a focal, firm area which is mobile near the proximal forearm.  I performed an informal bedside ultrasound of the arm which did not demonstrate any abscess. Plan for continued watchful waiting and PCP follow up.   Patient's presentation is most consistent with acute, uncomplicated illness.   Disposition: discharge  ____________________________________________  FINAL CLINICAL IMPRESSION(S) / ED DIAGNOSES  Final diagnoses:  Left arm pain     Note:  This document was prepared using Dragon voice recognition software and may include unintentional  dictation errors.  Abby Hocking, MD, [facility] Emergency [facility]    Kyleigh Nannini, Shereen Dike, MD 03/19/24 1045

## 2024-03-19 NOTE — Discharge Instructions (Signed)
 You were seen in the emerged from today with a bump on the left arm.  Not sure exactly what this is but does not seem like anything infectious or requiring specific treatment at this time. Please continue to monitor your symptoms and return with any new or suddenly worsening symptoms.

## 2024-03-19 NOTE — ED Triage Notes (Addendum)
 Pt states that she has had a lump on her left arm. Pain is noted to be in elbow and lower arm at times. Denies pain now.
